# Patient Record
Sex: Female | Born: 1940 | Race: Black or African American | Hispanic: No | Marital: Married | Smoking: Never smoker
Health system: Southern US, Community
[De-identification: ages and names within clinical notes are randomized; demographics above are authoritative.]

## PROBLEM LIST (undated history)

## (undated) DIAGNOSIS — H9190 Unspecified hearing loss, unspecified ear: Secondary | ICD-10-CM

## (undated) DIAGNOSIS — E119 Type 2 diabetes mellitus without complications: Secondary | ICD-10-CM

## (undated) DIAGNOSIS — I10 Essential (primary) hypertension: Secondary | ICD-10-CM

## (undated) DIAGNOSIS — F0390 Unspecified dementia without behavioral disturbance: Secondary | ICD-10-CM

## (undated) HISTORY — PX: ABDOMINAL HYSTERECTOMY: SHX81

---

## 2008-10-09 ENCOUNTER — Emergency Department (HOSPITAL_COMMUNITY): Admission: EM | Admit: 2008-10-09 | Discharge: 2008-10-09 | Payer: Self-pay | Admitting: Emergency Medicine

## 2009-03-21 ENCOUNTER — Ambulatory Visit (HOSPITAL_COMMUNITY): Admission: RE | Admit: 2009-03-21 | Discharge: 2009-03-21 | Payer: Self-pay | Admitting: Ophthalmology

## 2011-01-22 LAB — COMPREHENSIVE METABOLIC PANEL
ALT: 46 U/L — ABNORMAL HIGH (ref 0–35)
AST: 31 U/L (ref 0–37)
Albumin: 3.6 g/dL (ref 3.5–5.2)
CO2: 29 mEq/L (ref 19–32)
Calcium: 9.7 mg/dL (ref 8.4–10.5)
GFR calc Af Amer: 41 mL/min — ABNORMAL LOW (ref >60)
GFR calc non Af Amer: 34 mL/min — ABNORMAL LOW (ref >60)
Sodium: 144 mEq/L (ref 135–145)
Total Protein: 6.4 g/dL (ref 6.0–8.3)

## 2011-01-22 LAB — URINALYSIS, ROUTINE W REFLEX MICROSCOPIC
Bilirubin Urine: NEGATIVE
Glucose, UA: NEGATIVE mg/dL
Ketones, ur: NEGATIVE mg/dL
Specific Gravity, Urine: 1.015 (ref 1.005–1.030)
pH: 5.5 (ref 5.0–8.0)

## 2011-01-22 LAB — CBC
MCHC: 34 g/dL (ref 30.0–36.0)
Platelets: 211 10*3/uL (ref 150–400)
RBC: 4.15 MIL/uL (ref 3.87–5.11)
WBC: 4.4 10*3/uL (ref 4.0–10.5)

## 2011-01-22 LAB — GLUCOSE, CAPILLARY
Glucose-Capillary: 110 mg/dL — ABNORMAL HIGH (ref 70–99)
Glucose-Capillary: 85 mg/dL (ref 70–99)
Glucose-Capillary: 98 mg/dL (ref 70–99)

## 2011-01-22 LAB — URINE MICROSCOPIC-ADD ON

## 2011-02-27 NOTE — Op Note (Signed)
NAMESELEN, SMUCKER                ACCOUNT NO.:  [id number]   MEDICAL RECORD NO.:  [id number]          PATIENT TYPE:  AMB   LOCATION:  SDS                          FACILITY:  [facility]   PHYSICIAN:  Lanna Poche, M.D. 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   POSTOPERATIVE DIAGNOSIS:  Intravitreal hemorrhage with proliferative  diabetic retinopathy, right eye.   PROCEDURE:  Pars plana vitrectomy and panretinal photocoagulation, right  eye.   SURGEON:  Lanna Poche, MD   ANESTHESIA:  General endotracheal.   ESTIMATED BLOOD LOSS:  Less than 1 mL.   COMPLICATIONS:  None.   OPERATIVE NOTE:  The patient was taken to the operating room.  After  induction of general anesthesia, the right eye was prepped and draped in  the usual fashion.  Lid speculum was introduced.  Conjunctival  peritomies developed superotemporally and superonasally.  Hemostasis was  obtained with laser cautery.  Sclerotomies were fashioned 4 mm posterior  to the limbus at 1:30, 10:30 and 7:30.  The superior sclerotomies were  plugged and a 4-mm infusion cannula was secured at the 7:30 sclerotomy  with a temporary suture of 7-0 Vicryl.  The tip was visually inspected  and confirmed to lay in the vitreous cavity.  The Landers ring was  secured with a 7-0 Vicryl suture at  3 and 9.  Plugs were removed and 30-  degree prismatic lens was applied to the surface of the eye.  Central  core followed by peripheral vitrectomy was performed.  There was no  posterior hyaloid attachments and the posterior hyaloid was removed  without difficulty with considerable amount of hemorrhage underneath.  Scleral depression was used to further trim the inferior hemorrhagic  vitreous base.  The magnifying flat lens was applied to  the surface of  eye.  The small hemorrhage that settled in posterior pole was vacuumed  off the surface of the retina without difficulty.  The instruments were  removed from the eye. The holes were plugged.  Lens and ring were  removed.  Inspection with an ophthalmoscope and scleral depression  revealed there to be no peripheral retinal breaks or tears.  The small  gaps in the far peripheral panretinal photocoagulation was filled in,  although this was somewhat difficult in some areas had considerable  cortical spoking.  The superior sclerotomies were then closed with 7-0  Vicryl.  The infusion cannula was removed and preplaced sutures were  secured. The conjunctiva was then drawn and reapproximated with  interrupted running suture of 6-0 plain gut.  The pressure was checked  with Barraquer tonometer and found to lay at 15.  The subconjunctival  space was irrigated with 0.75% Marcaine, followed by subconjunctival  injection of 100 mg  of ceftazidime and 10 mg of Decadron.  Lid speculum was then removed and  mixed antibiotic ointment was applied to the surface of the eye.  An eye  patch and shield was then placed over the patient's right eye and the  patient left the operating room in stable condition.           ______________________________  Lanna Poche, M.D.     JTH/MEDQ  D:  03/21/2009  T:  03/22/2009  Job:  [id number]

## 2011-07-20 LAB — BASIC METABOLIC PANEL
BUN: 20 mg/dL (ref 6–23)
CO2: 33 mEq/L — ABNORMAL HIGH (ref 19–32)
Calcium: 9 mg/dL (ref 8.4–10.5)
GFR calc non Af Amer: 30 mL/min — ABNORMAL LOW (ref >60)
Glucose, Bld: 253 mg/dL — ABNORMAL HIGH (ref 70–99)

## 2011-07-20 LAB — CBC
Hemoglobin: 11.9 g/dL — ABNORMAL LOW (ref 12.0–15.0)
MCHC: 33.4 g/dL (ref 30.0–36.0)
Platelets: 227 10*3/uL (ref 150–400)
RDW: 14.1 % (ref 11.5–15.5)

## 2011-07-20 LAB — DIFFERENTIAL
Basophils Absolute: 0 10*3/uL (ref 0.0–0.1)
Basophils Relative: 1 % (ref 0–1)
Eosinophils Relative: 3 % (ref 0–5)
Lymphocytes Relative: 25 % (ref 12–46)
Monocytes Absolute: 0.6 10*3/uL (ref 0.1–1.0)
Neutro Abs: 3.3 10*3/uL (ref 1.7–7.7)

## 2015-04-28 ENCOUNTER — Encounter: Payer: Self-pay | Admitting: Emergency Medicine

## 2015-04-28 ENCOUNTER — Other Ambulatory Visit: Payer: Self-pay

## 2015-04-28 ENCOUNTER — Emergency Department
Admission: EM | Admit: 2015-04-28 | Discharge: 2015-04-28 | Disposition: A | Discharge status: Home / Self Care | Payer: Medicare Other | Attending: Emergency Medicine | Admitting: Emergency Medicine

## 2015-04-28 DIAGNOSIS — F0390 Unspecified dementia without behavioral disturbance: Secondary | ICD-10-CM | POA: Insufficient documentation

## 2015-04-28 DIAGNOSIS — I10 Essential (primary) hypertension: Secondary | ICD-10-CM | POA: Diagnosis not present

## 2015-04-28 DIAGNOSIS — N390 Urinary tract infection, site not specified: Secondary | ICD-10-CM | POA: Diagnosis not present

## 2015-04-28 DIAGNOSIS — E1165 Type 2 diabetes mellitus with hyperglycemia: Secondary | ICD-10-CM | POA: Insufficient documentation

## 2015-04-28 DIAGNOSIS — R739 Hyperglycemia, unspecified: Secondary | ICD-10-CM

## 2015-04-28 HISTORY — DX: Type 2 diabetes mellitus without complications: E11.9

## 2015-04-28 HISTORY — DX: Essential (primary) hypertension: I10

## 2015-04-28 HISTORY — DX: Unspecified dementia, unspecified severity, without behavioral disturbance, psychotic disturbance, mood disturbance, and anxiety: F03.90

## 2015-04-28 LAB — CBC
HEMATOCRIT: 46.5 % (ref 35.0–47.0)
Hemoglobin: 15.2 g/dL (ref 12.0–16.0)
MCH: 30.1 pg (ref 26.0–34.0)
MCHC: 32.7 g/dL (ref 32.0–36.0)
MCV: 92 fL (ref 80.0–100.0)
PLATELETS: 272 10*3/uL (ref 150–440)
RBC: 5.06 MIL/uL (ref 3.80–5.20)
RDW: 13.6 % (ref 11.5–14.5)
WBC: 7.4 10*3/uL (ref 3.6–11.0)

## 2015-04-28 LAB — BASIC METABOLIC PANEL
ANION GAP: 18 — AB (ref 5–15)
BUN: 83 mg/dL — ABNORMAL HIGH (ref 6–20)
CHLORIDE: 95 mmol/L — AB (ref 101–111)
CO2: 27 mmol/L (ref 22–32)
CREATININE: 2.43 mg/dL — AB (ref 0.44–1.00)
Calcium: 10.2 mg/dL (ref 8.9–10.3)
GFR calc non Af Amer: 19 mL/min — ABNORMAL LOW (ref >60)
GFR, EST AFRICAN AMERICAN: 22 mL/min — AB (ref >60)
Glucose, Bld: 552 mg/dL (ref 65–99)
Potassium: 5.1 mmol/L (ref 3.5–5.1)
Sodium: 140 mmol/L (ref 135–145)

## 2015-04-28 LAB — URINALYSIS COMPLETE WITH MICROSCOPIC (ARMC ONLY)
BILIRUBIN URINE: NEGATIVE
Glucose, UA: >500 mg/dL — AB
NITRITE: NEGATIVE
PROTEIN: NEGATIVE mg/dL
SPECIFIC GRAVITY, URINE: 1.021 (ref 1.005–1.030)
Squamous Epithelial / LPF: NONE SEEN
pH: 5 (ref 5.0–8.0)

## 2015-04-28 LAB — GLUCOSE, CAPILLARY
GLUCOSE-CAPILLARY: 540 mg/dL — AB (ref 65–99)
GLUCOSE-CAPILLARY: 546 mg/dL — AB (ref 65–99)
Glucose-Capillary: 332 mg/dL — ABNORMAL HIGH (ref 65–99)

## 2015-04-28 MED ORDER — INSULIN ASPART 100 UNIT/ML ~~LOC~~ SOLN
10.0000 [IU] | Freq: Once | SUBCUTANEOUS | Status: AC
  Administered 2015-04-28: 10 [IU] via INTRAVENOUS

## 2015-04-28 MED ORDER — CIPROFLOXACIN HCL 500 MG PO TABS
500.0000 mg | ORAL_TABLET | Freq: Once | ORAL | Status: AC
  Administered 2015-04-28: 500 mg via ORAL
  Filled 2015-04-28: qty 1

## 2015-04-28 MED ORDER — INSULIN ASPART 100 UNIT/ML ~~LOC~~ SOLN
SUBCUTANEOUS | Status: AC
  Administered 2015-04-28: 10 [IU] via INTRAVENOUS
  Filled 2015-04-28: qty 10

## 2015-04-28 MED ORDER — CIPROFLOXACIN HCL 500 MG PO TABS: 500.0000 mg | ORAL_TABLET | Freq: Two times a day (BID) | ORAL | Status: AC

## 2015-04-28 MED ORDER — SODIUM CHLORIDE 0.9 % IV SOLN
Freq: Once | INTRAVENOUS | Status: AC
  Administered 2015-04-28: 18:00:00 via INTRAVENOUS

## 2015-04-28 NOTE — ED Provider Notes (Signed)
[facility] Emergency Department Provider Note ____________________________________________  Time seen: Approximately 6:02 PM  I have reviewed the triage vital signs and the nursing notes.   HISTORY  Chief Complaint Hyperglycemia  Limited by severe dementia; history provided by spouse and son  HPI Kari Carr is a 74 y.o. female with severe dementia who presents from home with her husband and son. Her husband is her caregiver and he notes that her blood sugars have been high for 2 weeks in the 200s but today read critical high. She has had increased urination and dysuria and her primary care doctor has sent a urine but they're still waiting on the results.  She recently started Invokana 100 mg 7/8 as HgB A1C was 11 1.5 wk ago.  He is on Lantus 28 units every morning. She has had decreased by mouth intake for the past 5 days but has been drinking. She has not vomited. She has had loose bowel movements this week. No fevers. She had a cough on Monday but none since then.  Baseline, she has a difficult time understanding communicating with caregivers.  She is followed by [facility] Past Medical History  Diagnosis Date  . Dementia   . Hypertension   . Diabetes mellitus without complication     There are no active problems to display for this patient.   Past Surgical History  Procedure Laterality Date  . Abdominal hysterectomy      No current outpatient prescriptions on file.  Allergies Review of patient's allergies indicates not on file.  History reviewed. No pertinent family history.  Social History History  Substance Use Topics  . Smoking status: Never Smoker   . Smokeless tobacco: Not on file  . Alcohol Use: No    Review of Systems Constitutional: No fever ENT: No URI Respiratory: Denies shortness of breath. Gastrointestinal:no vomiting.  No diarrhea.   10-point ROS otherwise  negative.  ____________________________________________   PHYSICAL EXAM:  VITAL SIGNS: ED Triage Vitals  Enc Vitals Group     BP 04/28/15 1734 145/79 mmHg     Pulse Rate 04/28/15 1734 90     Resp 04/28/15 1734 16     Temp 04/28/15 1734 98.2 F (36.8 C)     Temp Source 04/28/15 1734 Oral     SpO2 04/28/15 1734 98 %     Weight 04/28/15 1734 178 lb (80.74 kg)     Height 04/28/15 1734 5\' 8"  (1.727 m)     Head Cir --      Peak Flow --      Pain Score --      Pain Loc --      Pain Edu? --      Excl. in GC? --    Constitutional: Alert. Well appearing and in no acute distress. Eyes: Conjunctivae are normal. PERRL. EOMI. Head: Atraumatic. Nose: No congestion/rhinnorhea. Mouth/Throat: Mucous membranes are slightly dry.  Oropharynx non-erythematous. Neck: No stridor.   Lymphatic: No cervical lymphadenopathy. Cardiovascular: Normal rate, regular rhythm. Grossly normal heart sounds.  Peripheral pulses 2+ B Respiratory: Normal respiratory effort.  No retractions. Lungs CTAB. Gastrointestinal: Soft.  Suprapubic tenderness without rebound or guarding. No distention. Normal bowel sounds.  Musculoskeletal: No lower extremity tenderness nor edema.  No calf TTP. Neurologic:  Normal speech and language. No gross focal neurologic deficits are appreciated. Speech is normal.  Skin:  Skin is warm, dry and intact. No rash noted. Psychiatric: Mood and affect are normal. Speech and behavior are  normal.  ____________________________________________   LABS (all labs ordered are listed, but only abnormal results are displayed)  Labs Reviewed  GLUCOSE, CAPILLARY - Abnormal; Notable for the following:    Glucose-Capillary 546 (*)    All other components within normal limits  GLUCOSE, CAPILLARY - Abnormal; Notable for the following:    Glucose-Capillary 540 (*)    All other components within normal limits   ____________________________________________  EKG   Date: 04/28/2015  Rate: 86   Rhythm: normal sinus rhythm  QRS Axis: normal  Intervals: normal  ST/T Wave abnormalities: normal  Conduction Disutrbances: none  Narrative Interpretation: unremarkable  ____________________________________________   PROCEDURES  Procedure(s) performed: none  Critical Care performed: none ____________________________________________   INITIAL IMPRESSION / ASSESSMENT AND PLAN / ED COURSE  Pertinent labs & imaging results that were available during my care of the patient were reviewed by me and considered in my medical decision making (see chart for details).  ----------------------------------------- 8:23 PM on 04/28/2015 -----------------------------------------  I suspect high blood sugar is from UTI.  Discussed plan with patient's husband and son. Patient's husband feels comfortable taking her home and will call family practice in the morning to find out if they would like to adjust her diabetic regimen. I will start Cipro while her urine culture is pending. ____________________________________________   FINAL CLINICAL IMPRESSION(S) / ED DIAGNOSES  UTI, hyperglycemia     Maurilio Lovely, MD 04/28/15 2024

## 2015-04-28 NOTE — ED Notes (Signed)
Husband states pts bs was 660 today.  Pt confused, husband states she has dementia

## 2015-04-28 NOTE — ED Notes (Signed)
Pt has HX of dementia. Husband reports FSBS of 534. Pt is not oriented to time or place.

## 2015-04-28 NOTE — ED Notes (Signed)
CRITICAL VALUE ALERT  Critical value received:  552 glucose  Date of notification:  04/28/2015  Time of notification:  1858  Critical value read back:Yes.    Nurse who received alert:  Art therapistAmber RN  MD notified (1st page):  Mclauren  Time of first page:  1858  MD notified (2nd page):  Time of second page:  Responding MD:  Mclauren  Time MD responded:  [id number]

## 2015-04-28 NOTE — Discharge Instructions (Signed)
Call family practice in the morning to find out if they would like to change diabetic medications and to let them know that Cipro has been started. Return to the ER for new or worsening symptoms, vomiting, fever, or for any other concerns.   Urinary Tract Infection Urinary tract infections (UTIs) can develop anywhere along your urinary tract. Your urinary tract is your body's drainage system for removing wastes and extra water. Your urinary tract includes two kidneys, two ureters, a bladder, and a urethra. Your kidneys are a pair of bean-shaped organs. Each kidney is about the size of your fist. They are located below your ribs, one on each side of your spine. CAUSES Infections are caused by microbes, which are microscopic organisms, including fungi, viruses, and bacteria. These organisms are so small that they can only be seen through a microscope. Bacteria are the microbes that most commonly cause UTIs. SYMPTOMS  Symptoms of UTIs may vary by age and gender of the patient and by the location of the infection. Symptoms in young women typically include a frequent and intense urge to urinate and a painful, burning feeling in the bladder or urethra during urination. Older women and men are more likely to be tired, shaky, and weak and have muscle aches and abdominal pain. A fever may mean the infection is in your kidneys. Other symptoms of a kidney infection include pain in your back or sides below the ribs, nausea, and vomiting. DIAGNOSIS To diagnose a UTI, your caregiver will ask you about your symptoms. Your caregiver also will ask to provide a urine sample. The urine sample will be tested for bacteria and white blood cells. White blood cells are made by your body to help fight infection. TREATMENT  Typically, UTIs can be treated with medication. Because most UTIs are caused by a bacterial infection, they usually can be treated with the use of antibiotics. The choice of antibiotic and length of treatment  depend on your symptoms and the type of bacteria causing your infection. HOME CARE INSTRUCTIONS  If you were prescribed antibiotics, take them exactly as your caregiver instructs you. Finish the medication even if you feel better after you have only taken some of the medication.  Drink enough water and fluids to keep your urine clear or pale yellow.  Avoid caffeine, tea, and carbonated beverages. They tend to irritate your bladder.  Empty your bladder often. Avoid holding urine for long periods of time.  Empty your bladder before and after sexual intercourse.  After a bowel movement, women should cleanse from front to back. Use each tissue only once. SEEK MEDICAL CARE IF:   You have back pain.  You develop a fever.  Your symptoms do not begin to resolve within 3 days. SEEK IMMEDIATE MEDICAL CARE IF:   You have severe back pain or lower abdominal pain.  You develop chills.  You have nausea or vomiting.  You have continued burning or discomfort with urination. MAKE SURE YOU:   Understand these instructions.  Will watch your condition.  Will get help right away if you are not doing well or get worse. Document Released: 07/11/2005 Document Revised: 04/01/2012 Document Reviewed: 11/09/2011 [facility] Patient Information 2015 [place]. This information is not intended to replace advice given to you by your health care provider. Make sure you discuss any questions you have with your health care provider.  High Blood Sugar High blood sugar (hyperglycemia) means that the level of sugar in your blood is higher than it should  be. Signs of high blood sugar include:  Feeling thirsty.  Frequent peeing (urinating).  Feeling tired or sleepy.  Dry mouth.  Vision changes.  Feeling weak.  Feeling hungry but losing weight.  Numbness and tingling in your hands or feet.  Headache. When you ignore these signs, your blood sugar may keep going up. These problems may get worse,  and other problems may begin. HOME CARE  Check your blood sugars as told by your doctor. Write down the numbers with the date and time.  Take the right amount of insulin or diabetes pills at the right time. Write down the dose with date and time.  Refill your insulin or diabetes pills before running out.  Watch what you eat. Follow your meal plan.  Drink liquids without sugar, such as water. Check with your doctor if you have kidney or heart disease.  Follow your doctor's orders for exercise. Exercise at the same time of day.  Keep your doctor's appointments. GET HELP RIGHT AWAY IF:   You have trouble thinking or are confused.  You have fast breathing with fruity smelling breath.  You pass out (faint).  You have 2 to 3 days of high blood sugars and you do not know why.  You have chest pain.  You are feeling sick to your stomach (nauseous) or throwing up (vomiting).  You have sudden vision changes. MAKE SURE YOU:   Understand these instructions.  Will watch your condition.  Will get help right away if you are not doing well or get worse. Document Released: 07/29/2009 Document Revised: 12/24/2011 Document Reviewed: 07/29/2009 [facility] Patient Information 2015 [place], [place]. This information is not intended to replace advice given to you by your health care provider. Make sure you discuss any questions you have with your health care provider.

## 2015-05-01 LAB — URINE CULTURE

## 2015-06-14 ENCOUNTER — Encounter: Payer: Self-pay | Admitting: Emergency Medicine

## 2015-06-14 ENCOUNTER — Emergency Department

## 2015-06-14 DIAGNOSIS — N3000 Acute cystitis without hematuria: Secondary | ICD-10-CM | POA: Diagnosis present

## 2015-06-14 DIAGNOSIS — K5900 Constipation, unspecified: Secondary | ICD-10-CM | POA: Diagnosis present

## 2015-06-14 DIAGNOSIS — M25559 Pain in unspecified hip: Secondary | ICD-10-CM | POA: Diagnosis present

## 2015-06-14 DIAGNOSIS — R339 Retention of urine, unspecified: Secondary | ICD-10-CM | POA: Diagnosis present

## 2015-06-14 DIAGNOSIS — R748 Abnormal levels of other serum enzymes: Secondary | ICD-10-CM | POA: Diagnosis present

## 2015-06-14 DIAGNOSIS — Z79899 Other long term (current) drug therapy: Secondary | ICD-10-CM

## 2015-06-14 DIAGNOSIS — H9190 Unspecified hearing loss, unspecified ear: Secondary | ICD-10-CM | POA: Diagnosis present

## 2015-06-14 DIAGNOSIS — R1030 Lower abdominal pain, unspecified: Secondary | ICD-10-CM | POA: Diagnosis present

## 2015-06-14 DIAGNOSIS — N183 Chronic kidney disease, stage 3 (moderate): Secondary | ICD-10-CM | POA: Diagnosis present

## 2015-06-14 DIAGNOSIS — E785 Hyperlipidemia, unspecified: Secondary | ICD-10-CM | POA: Diagnosis present

## 2015-06-14 DIAGNOSIS — R531 Weakness: Secondary | ICD-10-CM | POA: Diagnosis not present

## 2015-06-14 DIAGNOSIS — Z515 Encounter for palliative care: Secondary | ICD-10-CM

## 2015-06-14 DIAGNOSIS — I129 Hypertensive chronic kidney disease with stage 1 through stage 4 chronic kidney disease, or unspecified chronic kidney disease: Secondary | ICD-10-CM | POA: Diagnosis present

## 2015-06-14 DIAGNOSIS — Z66 Do not resuscitate: Secondary | ICD-10-CM | POA: Diagnosis present

## 2015-06-14 DIAGNOSIS — F0390 Unspecified dementia without behavioral disturbance: Secondary | ICD-10-CM | POA: Diagnosis present

## 2015-06-14 DIAGNOSIS — Z794 Long term (current) use of insulin: Secondary | ICD-10-CM

## 2015-06-14 DIAGNOSIS — Z6822 Body mass index (BMI) 22.0-22.9, adult: Secondary | ICD-10-CM

## 2015-06-14 DIAGNOSIS — A419 Sepsis, unspecified organism: Secondary | ICD-10-CM | POA: Diagnosis not present

## 2015-06-14 DIAGNOSIS — E43 Unspecified severe protein-calorie malnutrition: Secondary | ICD-10-CM | POA: Diagnosis present

## 2015-06-14 DIAGNOSIS — E1122 Type 2 diabetes mellitus with diabetic chronic kidney disease: Secondary | ICD-10-CM | POA: Diagnosis present

## 2015-06-14 LAB — LIPASE, BLOOD: Lipase: 13 U/L — ABNORMAL LOW (ref 22–51)

## 2015-06-14 LAB — CBC WITH DIFFERENTIAL/PLATELET
BASOS ABS: 0.1 10*3/uL (ref 0–0.1)
BASOS PCT: 1 %
EOS ABS: 0 10*3/uL (ref 0–0.7)
Eosinophils Relative: 0 %
HEMATOCRIT: 48 % — AB (ref 35.0–47.0)
Hemoglobin: 15.3 g/dL (ref 12.0–16.0)
Lymphocytes Relative: 16 %
Lymphs Abs: 2.3 10*3/uL (ref 1.0–3.6)
MCH: 29.6 pg (ref 26.0–34.0)
MCHC: 31.9 g/dL — ABNORMAL LOW (ref 32.0–36.0)
MCV: 92.9 fL (ref 80.0–100.0)
MONO ABS: 1.1 10*3/uL — AB (ref 0.2–0.9)
Monocytes Relative: 8 %
NEUTROS ABS: 10.5 10*3/uL — AB (ref 1.4–6.5)
NEUTROS PCT: 75 %
Platelets: 221 10*3/uL (ref 150–440)
RBC: 5.17 MIL/uL (ref 3.80–5.20)
RDW: 14.7 % — AB (ref 11.5–14.5)
WBC: 14.1 10*3/uL — ABNORMAL HIGH (ref 3.6–11.0)

## 2015-06-14 LAB — COMPREHENSIVE METABOLIC PANEL
ALK PHOS: 93 U/L (ref 38–126)
ALT: 22 U/L (ref 14–54)
ANION GAP: 12 (ref 5–15)
AST: 30 U/L (ref 15–41)
Albumin: 3.5 g/dL (ref 3.5–5.0)
BILIRUBIN TOTAL: 0.7 mg/dL (ref 0.3–1.2)
BUN: 51 mg/dL — ABNORMAL HIGH (ref 6–20)
CALCIUM: 10.2 mg/dL (ref 8.9–10.3)
CO2: 28 mmol/L (ref 22–32)
Chloride: 110 mmol/L (ref 101–111)
Creatinine, Ser: 1.83 mg/dL — ABNORMAL HIGH (ref 0.44–1.00)
GFR calc non Af Amer: 26 mL/min — ABNORMAL LOW (ref >60)
GFR, EST AFRICAN AMERICAN: 31 mL/min — AB (ref >60)
GLUCOSE: 246 mg/dL — AB (ref 65–99)
Potassium: 4.5 mmol/L (ref 3.5–5.1)
Sodium: 150 mmol/L — ABNORMAL HIGH (ref 135–145)
TOTAL PROTEIN: 7.9 g/dL (ref 6.5–8.1)

## 2015-06-14 LAB — GLUCOSE, CAPILLARY: GLUCOSE-CAPILLARY: 232 mg/dL — AB (ref 65–99)

## 2015-06-14 NOTE — ED Notes (Signed)
Pt arrived to the ED accompanied by her husband and family for complaints of left hip pain and some abdominal pain. Pt is a non verbal dementia Pt, therefore the family are the ones speaking for the Pt. Pt's family states that 2 days ago while they were bathing the Pt they noticed that the Pt was expressing and making gestures of having pain when moving her left hip/leg and when her abdomen was touched. Sherilyn Cooter RN noticed that  the Pt showed grimace when passively moving the left leg but no signs of pain when inspecting the Pt's abdomen. Pt's last BM was today according to the family and they described it as normal, but according to them a day ago the Pt's stool was hard, round and they saw some traces of bright blood. Pt is alert but non-verbal in no apparent distress.

## 2015-06-15 ENCOUNTER — Emergency Department

## 2015-06-15 ENCOUNTER — Inpatient Hospital Stay
Admission: EM | Admit: 2015-06-15 | Discharge: 2015-06-18 | DRG: 871 | Disposition: A | Discharge status: Hospice | Attending: Internal Medicine | Admitting: Internal Medicine

## 2015-06-15 ENCOUNTER — Encounter: Payer: Self-pay | Admitting: Internal Medicine

## 2015-06-15 DIAGNOSIS — A419 Sepsis, unspecified organism: Secondary | ICD-10-CM | POA: Diagnosis present

## 2015-06-15 DIAGNOSIS — R778 Other specified abnormalities of plasma proteins: Secondary | ICD-10-CM

## 2015-06-15 DIAGNOSIS — R7989 Other specified abnormal findings of blood chemistry: Secondary | ICD-10-CM

## 2015-06-15 DIAGNOSIS — R531 Weakness: Secondary | ICD-10-CM

## 2015-06-15 DIAGNOSIS — E1122 Type 2 diabetes mellitus with diabetic chronic kidney disease: Secondary | ICD-10-CM | POA: Diagnosis present

## 2015-06-15 DIAGNOSIS — N183 Chronic kidney disease, stage 3 (moderate): Secondary | ICD-10-CM | POA: Diagnosis present

## 2015-06-15 DIAGNOSIS — M25559 Pain in unspecified hip: Secondary | ICD-10-CM | POA: Diagnosis present

## 2015-06-15 DIAGNOSIS — H9190 Unspecified hearing loss, unspecified ear: Secondary | ICD-10-CM | POA: Diagnosis present

## 2015-06-15 DIAGNOSIS — R1030 Lower abdominal pain, unspecified: Secondary | ICD-10-CM

## 2015-06-15 DIAGNOSIS — F0390 Unspecified dementia without behavioral disturbance: Secondary | ICD-10-CM | POA: Diagnosis present

## 2015-06-15 DIAGNOSIS — Z79899 Other long term (current) drug therapy: Secondary | ICD-10-CM | POA: Diagnosis not present

## 2015-06-15 DIAGNOSIS — Z66 Do not resuscitate: Secondary | ICD-10-CM | POA: Diagnosis present

## 2015-06-15 DIAGNOSIS — I129 Hypertensive chronic kidney disease with stage 1 through stage 4 chronic kidney disease, or unspecified chronic kidney disease: Secondary | ICD-10-CM | POA: Diagnosis present

## 2015-06-15 DIAGNOSIS — E43 Unspecified severe protein-calorie malnutrition: Secondary | ICD-10-CM | POA: Insufficient documentation

## 2015-06-15 DIAGNOSIS — R748 Abnormal levels of other serum enzymes: Secondary | ICD-10-CM | POA: Diagnosis present

## 2015-06-15 DIAGNOSIS — Z794 Long term (current) use of insulin: Secondary | ICD-10-CM | POA: Diagnosis not present

## 2015-06-15 DIAGNOSIS — N390 Urinary tract infection, site not specified: Secondary | ICD-10-CM

## 2015-06-15 DIAGNOSIS — R339 Retention of urine, unspecified: Secondary | ICD-10-CM | POA: Diagnosis present

## 2015-06-15 DIAGNOSIS — E785 Hyperlipidemia, unspecified: Secondary | ICD-10-CM | POA: Diagnosis present

## 2015-06-15 DIAGNOSIS — N3000 Acute cystitis without hematuria: Secondary | ICD-10-CM | POA: Diagnosis present

## 2015-06-15 DIAGNOSIS — Z515 Encounter for palliative care: Secondary | ICD-10-CM | POA: Diagnosis not present

## 2015-06-15 DIAGNOSIS — Z6822 Body mass index (BMI) 22.0-22.9, adult: Secondary | ICD-10-CM | POA: Diagnosis not present

## 2015-06-15 DIAGNOSIS — K5900 Constipation, unspecified: Secondary | ICD-10-CM | POA: Diagnosis present

## 2015-06-15 HISTORY — DX: Unspecified hearing loss, unspecified ear: H91.90

## 2015-06-15 LAB — CBC
HEMATOCRIT: 45.4 % (ref 35.0–47.0)
HEMOGLOBIN: 14.4 g/dL (ref 12.0–16.0)
MCH: 29.9 pg (ref 26.0–34.0)
MCHC: 31.8 g/dL — AB (ref 32.0–36.0)
MCV: 93.9 fL (ref 80.0–100.0)
Platelets: 142 10*3/uL — ABNORMAL LOW (ref 150–440)
RBC: 4.83 MIL/uL (ref 3.80–5.20)
RDW: 15 % — ABNORMAL HIGH (ref 11.5–14.5)
WBC: 10.2 10*3/uL (ref 3.6–11.0)

## 2015-06-15 LAB — URINALYSIS COMPLETE WITH MICROSCOPIC (ARMC ONLY)
BILIRUBIN URINE: NEGATIVE
Hgb urine dipstick: NEGATIVE
KETONES UR: NEGATIVE mg/dL
Nitrite: NEGATIVE
PROTEIN: NEGATIVE mg/dL
SPECIFIC GRAVITY, URINE: 1.017 (ref 1.005–1.030)
pH: 5 (ref 5.0–8.0)

## 2015-06-15 LAB — BASIC METABOLIC PANEL
ANION GAP: 9 (ref 5–15)
BUN: 53 mg/dL — ABNORMAL HIGH (ref 6–20)
CALCIUM: 9.7 mg/dL (ref 8.9–10.3)
CO2: 25 mmol/L (ref 22–32)
Chloride: 118 mmol/L — ABNORMAL HIGH (ref 101–111)
Creatinine, Ser: 1.47 mg/dL — ABNORMAL HIGH (ref 0.44–1.00)
GFR, EST AFRICAN AMERICAN: 40 mL/min — AB (ref >60)
GFR, EST NON AFRICAN AMERICAN: 34 mL/min — AB (ref >60)
GLUCOSE: 161 mg/dL — AB (ref 65–99)
POTASSIUM: 4.5 mmol/L (ref 3.5–5.1)
SODIUM: 152 mmol/L — AB (ref 135–145)

## 2015-06-15 LAB — TROPONIN I: Troponin I: 0.05 ng/mL — ABNORMAL HIGH (ref <0.031)

## 2015-06-15 LAB — GLUCOSE, CAPILLARY
Glucose-Capillary: 204 mg/dL — ABNORMAL HIGH (ref 65–99)
Glucose-Capillary: 171 mg/dL — ABNORMAL HIGH (ref 65–99)
Glucose-Capillary: 147 mg/dL — ABNORMAL HIGH (ref 65–99)
Glucose-Capillary: 129 mg/dL — ABNORMAL HIGH (ref 65–99)

## 2015-06-15 LAB — HEMOGLOBIN A1C: Hgb A1c MFr Bld: 11 % — ABNORMAL HIGH (ref 4.0–6.0)

## 2015-06-15 LAB — TSH: TSH: 1.615 u[IU]/mL (ref 0.350–4.500)

## 2015-06-15 MED ORDER — ACETAMINOPHEN 650 MG RE SUPP: 650.0000 mg | Freq: Four times a day (QID) | RECTAL | Status: DC | PRN

## 2015-06-15 MED ORDER — ACETAMINOPHEN 325 MG PO TABS: 650.0000 mg | ORAL_TABLET | Freq: Four times a day (QID) | ORAL | Status: DC | PRN

## 2015-06-15 MED ORDER — ONDANSETRON HCL 4 MG/2ML IJ SOLN: 4.0000 mg | Freq: Four times a day (QID) | INTRAMUSCULAR | Status: DC | PRN

## 2015-06-15 MED ORDER — SODIUM CHLORIDE 0.9 % IV BOLUS (SEPSIS)
500.0000 mL | Freq: Once | INTRAVENOUS | Status: AC
  Administered 2015-06-15: 500 mL via INTRAVENOUS

## 2015-06-15 MED ORDER — LISINOPRIL-HYDROCHLOROTHIAZIDE 20-12.5 MG PO TABS: 1.0000 | ORAL_TABLET | Freq: Every day | ORAL | Status: DC

## 2015-06-15 MED ORDER — INSULIN ASPART 100 UNIT/ML ~~LOC~~ SOLN
4.0000 [IU] | Freq: Once | SUBCUTANEOUS | Status: AC
Start: 2015-06-15 — End: 2015-06-15
  Administered 2015-06-15: 4 [IU] via SUBCUTANEOUS
  Filled 2015-06-15: qty 4

## 2015-06-15 MED ORDER — ENSURE ENLIVE PO LIQD
237.0000 mL | Freq: Three times a day (TID) | ORAL | Status: DC
  Administered 2015-06-16 – 2015-06-18 (×6): 237 mL via ORAL

## 2015-06-15 MED ORDER — LISINOPRIL 20 MG PO TABS
20.0000 mg | ORAL_TABLET | Freq: Every day | ORAL | Status: DC
  Administered 2015-06-15 – 2015-06-18 (×4): 20 mg via ORAL
  Filled 2015-06-15 (×4): qty 1

## 2015-06-15 MED ORDER — ONDANSETRON HCL 4 MG PO TABS: 4.0000 mg | ORAL_TABLET | Freq: Four times a day (QID) | ORAL | Status: DC | PRN

## 2015-06-15 MED ORDER — INSULIN GLARGINE 100 UNIT/ML ~~LOC~~ SOLN
20.0000 [IU] | Freq: Every day | SUBCUTANEOUS | Status: DC
  Administered 2015-06-16: 20 [IU] via SUBCUTANEOUS
  Filled 2015-06-15 (×3): qty 0.2

## 2015-06-15 MED ORDER — SIMVASTATIN 20 MG PO TABS
20.0000 mg | ORAL_TABLET | Freq: Every day | ORAL | Status: DC
  Administered 2015-06-15 – 2015-06-17 (×3): 20 mg via ORAL
  Filled 2015-06-15 (×3): qty 1

## 2015-06-15 MED ORDER — SODIUM CHLORIDE 0.9 % IJ SOLN
3.0000 mL | Freq: Two times a day (BID) | INTRAMUSCULAR | Status: DC
  Administered 2015-06-15 – 2015-06-17 (×4): 3 mL via INTRAVENOUS

## 2015-06-15 MED ORDER — HYDROCHLOROTHIAZIDE 12.5 MG PO CAPS
12.5000 mg | ORAL_CAPSULE | Freq: Every day | ORAL | Status: DC
  Administered 2015-06-16 – 2015-06-17 (×2): 12.5 mg via ORAL
  Filled 2015-06-15 (×3): qty 1

## 2015-06-15 MED ORDER — MORPHINE SULFATE (PF) 2 MG/ML IV SOLN: 1.0000 mg | INTRAVENOUS | Status: DC | PRN

## 2015-06-15 MED ORDER — CLONIDINE HCL 0.1 MG PO TABS
0.1000 mg | ORAL_TABLET | Freq: Two times a day (BID) | ORAL | Status: DC
  Administered 2015-06-15 – 2015-06-17 (×4): 0.1 mg via ORAL
  Filled 2015-06-15 (×6): qty 1

## 2015-06-15 MED ORDER — DEXTROSE 5 % IV SOLN
1.0000 g | INTRAVENOUS | Status: DC
  Administered 2015-06-15 – 2015-06-18 (×4): 1 g via INTRAVENOUS
  Filled 2015-06-15 (×5): qty 10

## 2015-06-15 MED ORDER — HEPARIN SODIUM (PORCINE) 5000 UNIT/ML IJ SOLN
5000.0000 [IU] | Freq: Three times a day (TID) | INTRAMUSCULAR | Status: DC
  Administered 2015-06-15 (×2): 5000 [IU] via SUBCUTANEOUS
  Filled 2015-06-15 (×2): qty 1

## 2015-06-15 MED ORDER — INSULIN ASPART 100 UNIT/ML ~~LOC~~ SOLN
0.0000 [IU] | Freq: Three times a day (TID) | SUBCUTANEOUS | Status: DC
  Administered 2015-06-15: 3 [IU] via SUBCUTANEOUS
  Administered 2015-06-15: 2 [IU] via SUBCUTANEOUS
  Administered 2015-06-15: 5 [IU] via SUBCUTANEOUS
  Administered 2015-06-16: 15 [IU] via SUBCUTANEOUS
  Administered 2015-06-16 – 2015-06-17 (×3): 3 [IU] via SUBCUTANEOUS
  Administered 2015-06-17 (×2): 5 [IU] via SUBCUTANEOUS
  Administered 2015-06-18: 11 [IU] via SUBCUTANEOUS
  Filled 2015-06-15: qty 5
  Filled 2015-06-15: qty 11
  Filled 2015-06-15 (×2): qty 3
  Filled 2015-06-15 (×2): qty 5
  Filled 2015-06-15: qty 2
  Filled 2015-06-15: qty 15
  Filled 2015-06-15: qty 5
  Filled 2015-06-15: qty 3

## 2015-06-15 MED ORDER — AMLODIPINE BESYLATE 5 MG PO TABS
5.0000 mg | ORAL_TABLET | Freq: Every day | ORAL | Status: DC
  Administered 2015-06-15 – 2015-06-18 (×4): 5 mg via ORAL
  Filled 2015-06-15 (×4): qty 1

## 2015-06-15 MED ORDER — DOCUSATE SODIUM 100 MG PO CAPS
100.0000 mg | ORAL_CAPSULE | Freq: Two times a day (BID) | ORAL | Status: DC
  Administered 2015-06-16 – 2015-06-17 (×2): 100 mg via ORAL
  Filled 2015-06-15 (×4): qty 1

## 2015-06-15 MED ORDER — SODIUM CHLORIDE 0.9 % IV SOLN
INTRAVENOUS | Status: DC
  Administered 2015-06-15: 06:00:00 via INTRAVENOUS

## 2015-06-15 MED ORDER — SODIUM CHLORIDE 0.45 % IV SOLN
INTRAVENOUS | Status: DC
  Administered 2015-06-15 – 2015-06-17 (×2): via INTRAVENOUS

## 2015-06-15 MED ORDER — ENOXAPARIN SODIUM 30 MG/0.3ML ~~LOC~~ SOLN
30.0000 mg | SUBCUTANEOUS | Status: DC
  Administered 2015-06-15: 30 mg via SUBCUTANEOUS
  Filled 2015-06-15: qty 0.3

## 2015-06-15 NOTE — ED Notes (Signed)
Family to desk inquiring over wait time and lab results; pt noted sitting in w/c in lobby, alert with no distress; updated family on wait time; reviewed triage notes and labs; charge called and notified of need for next bed for further evaluation

## 2015-06-15 NOTE — Progress Notes (Addendum)
Initial Nutrition Assessment  DOCUMENTATION CODES:   Severe malnutrition in context of acute illness/injury  INTERVENTION:   Meals and Snacks: Cater to patient preferences; recommend atleast dysphagia III diet order for chopped meats with gravy and no raw vegetables Coordination of Care: if pt at risk for aspiration, recommend SLP evaulation. Medical Food Supplement Therapy: will recommend Ensure Enlive po TID, each supplement provides 350 kcal and 20 grams of protein, as pt was drinking PTA.   NUTRITION DIAGNOSIS:   Inadequate oral intake related to acute illness as evidenced by per patient/family report.  GOAL:   Patient will meet greater than or equal to 90% of their needs  MONITOR:    (Energy Intake, Digestive System, Anthropometrics, Electrolyte and renal Profile)  REASON FOR ASSESSMENT:   Consult Poor PO  ASSESSMENT:   Pt admitted with sepsis with UTI. Pt with h/o dementia, minimally verbal.  Past Medical History  Diagnosis Date  . Dementia   . Hypertension   . Diabetes mellitus without complication   . Hearing deficit     Diet Order:  DIET DYS 3 Room service appropriate?: Yes; Fluid consistency:: Thin    Current Nutrition: Per husband pt ate 0% this am.   Food/Nutrition-Related History: Per husband pt was eating as usual until this past Friday; Pt has been eating only broth since Friday. Husband reports pt taking Ensure up until Friday but pt did not want Ensure either. Pt husband says usually pt eats breakfast and dinner but not usually lunch. Pt husband reports pt sometimes has difficulty chewing/swallowing.   Medications: NS at 157mL/hr, Lantus, Novolog, Colace  Electrolyte/Renal Profile and Glucose Profile:   Recent Labs Lab 06/14/15 2139  NA 150*  K 4.5  CL 110  CO2 28  BUN 51*  CREATININE 1.83*  CALCIUM 10.2  GLUCOSE 246*   Protein Profile:   Recent Labs Lab 06/14/15 2139  ALBUMIN 3.5    Gastrointestinal Profile: Last BM:   06/15/2015   Nutrition-Focused Physical Exam Findings: Nutrition-Focused physical exam completed. Findings are WDL for fat depletion, muscle depletion, and edema.    Weight Change: Pt husband reports pt weight of 155lbs last Tuesday at [facility] office outpatient (6% weight loss in one week) and 180lbs one month ago. Per [facility], 19% weight loss in 5 weeks.   Skin:  Reviewed, no issues  Last BM:  06/15/2015  Height:   Ht Readings from Last 1 Encounters:  06/14/15  (1.753 m)    Weight:   Wt Readings from Last 1 Encounters:  06/15/15 145 lb 6.4 oz (65.953 kg)    Wt Readings from Last 10 Encounters:  06/15/15 145 lb 6.4 oz (65.953 kg)  04/28/15 178 lb (80.74 kg)    BMI:  Body mass index is 21.46 kg/(m^2).  Estimated Nutritional Needs:   Kcal:  BEE: 1233kcals, TEE: (IF 1.1-1.3)(AF 1.2) 1628-1904kcals  Protein:  53-65g protein (0.8-1.0g/kg)   Fluid:  1648-1937mL of fluid (25-68mL/kg)  EDUCATION NEEDS:   No education needs identified at this time   HIGH Care Level  Leda Quail, RD, LDN Pager [phone]

## 2015-06-15 NOTE — ED Notes (Signed)
POC CBG= 285 mg/dL

## 2015-06-15 NOTE — H&P (Signed)
Kari Carr is an 74 y.o. female.   Chief Complaint: Abdominal pain HPI: Patient presents emergency department after indicating that she had abdominal pain. The patient is nonverbal secondary to dementia but she is usually able to stand and walk around the house on her own. She has been significantly weaker and prefers to lay in bed for the last 5 days. Patient's husband denies that she has had fever, nausea or vomiting. He said he states that she has been pointing to her abdomen and rubbing her belly as if she is in pain. She also seems to be in pain when she is rolled in bed particularly to her left side which appears to cause her some pain as well. In the emergency department the patient was found to have a urinary tract infection which prompted the emergency department staff to call for admission.  Past Medical History  Diagnosis Date  . Dementia   . Hypertension   . Diabetes mellitus without complication   . Hearing deficit     Past Surgical History  Procedure Laterality Date  . Abdominal hysterectomy      Family History  Problem Relation Age of Onset  . Diabetes Mellitus II Mother    Social History:  reports that she has never smoked. She does not have any smokeless tobacco history on file. She reports that she does not drink alcohol or use illicit drugs.  Allergies: No Known Allergies  Prior to Admission medications   Medication Sig Start Date End Date Taking? Authorizing Provider  amLODipine (NORVASC) 5 MG tablet Take 1 tablet by mouth daily. 05/24/15  Yes Historical Provider, MD  cloNIDine (CATAPRES) 0.1 MG tablet Take 0.1 mg by mouth 2 (two) times daily.   Yes Historical Provider, MD  LANTUS 100 UNIT/ML injection Inject 28 Units as directed daily. 04/28/15  Yes Historical Provider, MD  lisinopril-hydrochlorothiazide (PRINZIDE,ZESTORETIC) 20-12.5 MG per tablet Take 1 tablet by mouth daily.   Yes Historical Provider, MD  simvastatin (ZOCOR) 20 MG tablet Take 20 mg by mouth  daily.   Yes Historical Provider, MD     Results for orders placed or performed during the hospital encounter of 06/15/15 (from the past 48 hour(s))  Lipase, blood     Status: Abnormal   Collection Time: 06/14/15  9:39 PM  Result Value Ref Range   Lipase 13 (L) 22 - 51 U/L  Comprehensive metabolic panel     Status: Abnormal   Collection Time: 06/14/15  9:39 PM  Result Value Ref Range   Sodium 150 (H) 135 - 145 mmol/L   Potassium 4.5 3.5 - 5.1 mmol/L   Chloride 110 101 - 111 mmol/L   CO2 28 22 - 32 mmol/L   Glucose, Bld 246 (H) 65 - 99 mg/dL   BUN 51 (H) 6 - 20 mg/dL   Creatinine, Ser 1.83 (H) 0.44 - 1.00 mg/dL   Calcium 10.2 8.9 - 10.3 mg/dL   Total Protein 7.9 6.5 - 8.1 g/dL   Albumin 3.5 3.5 - 5.0 g/dL   AST 30 15 - 41 U/L   ALT 22 14 - 54 U/L   Alkaline Phosphatase 93 38 - 126 U/L   Total Bilirubin 0.7 0.3 - 1.2 mg/dL   GFR calc non Af Amer 26 (L) >60 mL/min   GFR calc Af Amer 31 (L) >60 mL/min    Comment: (NOTE) The eGFR has been calculated using the CKD EPI equation. This calculation has not been validated in all clinical situations. eGFR's  persistently <60 mL/min signify possible Chronic Kidney Disease.    Anion gap 12 5 - 15  CBC WITH DIFFERENTIAL     Status: Abnormal   Collection Time: 06/14/15  9:39 PM  Result Value Ref Range   WBC 14.1 (H) 3.6 - 11.0 K/uL   RBC 5.17 3.80 - 5.20 MIL/uL   Hemoglobin 15.3 12.0 - 16.0 g/dL   HCT 48.0 (H) 35.0 - 47.0 %   MCV 92.9 80.0 - 100.0 fL   MCH 29.6 26.0 - 34.0 pg   MCHC 31.9 (L) 32.0 - 36.0 g/dL   RDW 14.7 (H) 11.5 - 14.5 %   Platelets 221 150 - 440 K/uL   Neutrophils Relative % 75 %   Neutro Abs 10.5 (H) 1.4 - 6.5 K/uL   Lymphocytes Relative 16 %   Lymphs Abs 2.3 1.0 - 3.6 K/uL   Monocytes Relative 8 %   Monocytes Absolute 1.1 (H) 0.2 - 0.9 K/uL   Eosinophils Relative 0 %   Eosinophils Absolute 0.0 0 - 0.7 K/uL   Basophils Relative 1 %   Basophils Absolute 0.1 0 - 0.1 K/uL  Troponin I     Status: Abnormal    Collection Time: 06/14/15  9:39 PM  Result Value Ref Range   Troponin I 0.05 (H) <0.031 ng/mL    Comment: READ BACK AND VERIFIED ANDREA BRYANT 06/15/2015 0048 [facility]        PERSISTENTLY INCREASED TROPONIN VALUES IN THE RANGE OF 0.04-0.49 ng/mL CAN BE SEEN IN:       -UNSTABLE ANGINA       -CONGESTIVE HEART FAILURE       -MYOCARDITIS       -CHEST TRAUMA       -ARRYHTHMIAS       -LATE PRESENTING MYOCARDIAL INFARCTION       -COPD   CLINICAL FOLLOW-UP RECOMMENDED.   Glucose, capillary     Status: Abnormal   Collection Time: 06/14/15  9:41 PM  Result Value Ref Range   Glucose-Capillary 232 (H) 65 - 99 mg/dL  Urinalysis complete, with microscopic ([facility] only)     Status: Abnormal   Collection Time: 06/15/15  1:24 AM  Result Value Ref Range   Color, Urine YELLOW (A) YELLOW   APPearance HAZY (A) CLEAR   Glucose, UA >500 (A) NEGATIVE mg/dL   Bilirubin Urine NEGATIVE NEGATIVE   Ketones, ur NEGATIVE NEGATIVE mg/dL   Specific Gravity, Urine 1.017 1.005 - 1.030   Hgb urine dipstick NEGATIVE NEGATIVE   pH 5.0 5.0 - 8.0   Protein, ur NEGATIVE NEGATIVE mg/dL   Nitrite NEGATIVE NEGATIVE   Leukocytes, UA 2+ (A) NEGATIVE   RBC / HPF 0-5 0 - 5 RBC/hpf   WBC, UA 6-30 0 - 5 WBC/hpf   Bacteria, UA MANY (A) NONE SEEN   Squamous Epithelial / LPF 0-5 (A) NONE SEEN   Dg Abd Acute W/chest  06/15/2015   CLINICAL DATA:  Abdominal pain.  EXAM: DG ABDOMEN ACUTE W/ 1V CHEST  COMPARISON:  03/21/2009  FINDINGS: There is no evidence of dilated bowel loops or free intraperitoneal air. No radiopaque calculi or other significant radiographic abnormality is seen. There is generous colonic stool volume. Heart size and mediastinal contours are within normal limits. Both lungs are clear.  IMPRESSION: Negative abdominal radiographs. No acute cardiopulmonary disease. Generous colonic stool volume.   Electronically Signed   By: Andreas Newport M.D.   On: 06/15/2015 01:18   Dg Hip Unilat With Pelvis 2-3 Views  Left  06/14/2015   CLINICAL DATA:  Left hip pain.  EXAM: DG HIP (WITH OR WITHOUT PELVIS) 2-3V LEFT  COMPARISON:  None.  FINDINGS: Negative for fracture, dislocation or radiopaque foreign body. Minimal arthritic changes are present. There is no bone lesion or bony destruction.  IMPRESSION: Negative.   Electronically Signed   By: Andreas Newport M.D.   On: 06/14/2015 22:13    Review of Systems  Unable to perform ROS: patient nonverbal    Blood pressure 143/77, pulse 101, temperature 98.2 F (36.8 C), temperature source Oral, resp. rate 16, height 5' 9"  (1.753 m), weight 70.308 kg (155 lb), SpO2 99 %. Physical Exam  Nursing note and vitals reviewed. Constitutional: She is oriented to person, place, and time. She appears well-developed and well-nourished. No distress.  HENT:  Head: Normocephalic and atraumatic.  Mouth/Throat: Oropharynx is clear and moist.  Eyes: EOM are normal. Pupils are equal, round, and reactive to light. No scleral icterus.  Neck: Normal range of motion. Neck supple. No JVD present. No tracheal deviation present. No thyromegaly present.  Cardiovascular: Normal rate, regular rhythm and normal heart sounds.  Exam reveals no gallop and no friction rub.   No murmur heard. Respiratory: Effort normal and breath sounds normal.  GI: Soft. Bowel sounds are normal. She exhibits no distension. There is no tenderness.  Genitourinary:  Deferred  Musculoskeletal: Normal range of motion. She exhibits no edema.  Lymphadenopathy:    She has no cervical adenopathy.  Neurological: She is alert and oriented to person, place, and time. No cranial nerve deficit. She exhibits normal muscle tone.  Skin: Skin is warm and dry. No rash noted. No erythema.  Psychiatric: She has a normal mood and affect. Her behavior is normal. Judgment and thought content normal.     Assessment/Plan 74 year old African-American female admitted for urinary tract infection and sepsis. 1. Urinary tract  infection: The patient is received Rocephin in the emergency department. We will obtain urine cultures and we will follow for sensitivities and adjust antibiotic coverage as necessary. 2. Sepsis: The patient meets criteria via tachycardia and leukocytosis. Her blood pressure is good. Blood cultures have been obtained, albeit after antibiotics. 3. Diabetes mellitus type II: Continue basal insulin coverage adjusted for hospital diet. I will also place patient on sliding scale insulin. 4. Hypertension: Continue Norvasc and clonidine 5. Hyperlipidemia: Continue simvastatin 6. DVT prophylaxis: Heparin 7. GI prophylaxis: None The patient is a full code. Time spent on admission orders and patient care approximately 35 minutes  Harrie Foreman 06/15/2015, 4:38 AM

## 2015-06-15 NOTE — Care Management Note (Signed)
Case Management Note  Patient Details  Name: Kari Carr MRN: [id number] Date of Birth: 14-Oct-1941  Subjective/Objective:                  Met with patient's husband; patient was sleeping. He states that she is baseline hard of hearing but "unable to make healthcare decisions due to lack of mental capacity" per her husband. He states she was able to walk up until last Friday. He feels that she can ride in a personal vehicle to home. She is followed by [facility] in the home. They have provided a hospital bed. Per Santiago Glad with [facility] patient is a DNR but showing as FULL code here.  Husband states he has help from her siblings. He denies RNCM needs at this time.   Action/Plan: I have notified Santiago Glad with [facility] hospice of patient admission. I will also create EMS packet just in case it is needed at time of discharge- please follow address changes. RNCM contact card supplied to patient's husband.   Expected Discharge Date:                  Expected Discharge Plan:     In-House Referral:     Discharge planning Services  CM Consult  Post Acute Care Choice:    Choice offered to:  Patient  DME Arranged:  N/A DME Agency:     HH Arranged:  NA HH Agency:  [facility]  Status of Service:  In process, will continue to follow  Medicare Important Message Given:    Date Medicare IM Given:    Medicare IM give by:    Date Additional Medicare IM Given:    Additional Medicare Important Message give by:     If discussed at [place] of Stay Meetings, dates discussed:    Additional Comments:  Marshell Garfinkel, RN 06/15/2015, 8:47 AM

## 2015-06-15 NOTE — Progress Notes (Signed)
Patient bladder scan per MD order, bladder scan result >999, Dr.Walsh notified, order obtained to place foley cath, foley placed- 14 fr, patient tolerated procedure well.  Will cont to monitor the pt.

## 2015-06-15 NOTE — Progress Notes (Addendum)
Spoke with Dr.Walsh regarding patient needing a speech consult- holding food and med's in mouth

## 2015-06-15 NOTE — Progress Notes (Signed)
[facility] [place] [facility]   PATIENT NAME: Kari Carr    MR#:  [id number]  DATE OF BIRTH:  [birth year]  SUBJECTIVE:  CHIEF COMPLAINT:   Chief Complaint  Patient presents with  . Hip Pain  . Abdominal Pain   Nonverbal. Seems to have significant abdominal pain.  REVIEW OF SYSTEMS:   ROS unable to obtain as the patient is nonverbal  DRUG ALLERGIES:  No Known Allergies  VITALS:  Blood pressure 154/78, pulse 84, temperature 96.6 F (35.9 C), temperature source Axillary, resp. rate 18, height  (1.753 m), weight 65.953 kg (145 lb 6.4 oz), SpO2 98 %.  PHYSICAL EXAMINATION:  GENERAL:  74 y.o.-year-old patient lying in the bed with no acute distress.  EYES: Pupils equal, round, reactive to light and accommodation. No scleral icterus. Extraocular muscles intact.  HEENT: Head atraumatic, normocephalic. Oropharynx and nasopharynx clear. Mucous membranes are moist NECK:  Supple, no jugular venous distention. No thyroid enlargement, no tenderness.  LUNGS: Normal breath sounds bilaterally, no wheezing, rales,rhonchi or crepitation. No use of accessory muscles of respiration.  CARDIOVASCULAR: S1, S2 normal. No murmurs, rubs, or gallops.  ABDOMEN: Lower abdomen is distended and tender. Bowel sounds present. Guarding and grimacing with palpation.  EXTREMITIES: No pedal edema, cyanosis, or clubbing.  NEUROLOGIC: Cranial nerves II through XII are grossly intact. She does not participate in the rest of the neurologic examination  PSYCHIATRIC: The patient is alert but nonverbal, does not follow my commands  SKIN: No obvious rash, lesion, or ulcer.    LABORATORY PANEL:   CBC  Recent Labs Lab 06/14/15 2139  WBC 14.1*  HGB 15.3  HCT 48.0*  PLT 221   ------------------------------------------------------------------------------------------------------------------  Chemistries   Recent Labs Lab 06/14/15 2139  NA 150*  K 4.5  CL 110  CO2 28   GLUCOSE 246*  BUN 51*  CREATININE 1.83*  CALCIUM 10.2  AST 30  ALT 22  ALKPHOS 93  BILITOT 0.7   ------------------------------------------------------------------------------------------------------------------  Cardiac Enzymes  Recent Labs Lab 06/14/15 2139  TROPONINI 0.05*   ------------------------------------------------------------------------------------------------------------------  RADIOLOGY:  Dg Abd Acute W/chest  06/15/2015   CLINICAL DATA:  Abdominal pain.  EXAM: DG ABDOMEN ACUTE W/ 1V CHEST  COMPARISON:  03/21/2009  FINDINGS: There is no evidence of dilated bowel loops or free intraperitoneal air. No radiopaque calculi or other significant radiographic abnormality is seen. There is generous colonic stool volume. Heart size and mediastinal contours are within normal limits. Both lungs are clear.  IMPRESSION: Negative abdominal radiographs. No acute cardiopulmonary disease. Generous colonic stool volume.   Electronically Signed   By: Ellery Plunk M.D.   On: 06/15/2015 01:18   Dg Hip Unilat With Pelvis 2-3 Views Left  06/14/2015   CLINICAL DATA:  Left hip pain.  EXAM: DG HIP (WITH OR WITHOUT PELVIS) 2-3V LEFT  COMPARISON:  None.  FINDINGS: Negative for fracture, dislocation or radiopaque foreign body. Minimal arthritic changes are present. There is no bone lesion or bony destruction.  IMPRESSION: Negative.   Electronically Signed   By: Ellery Plunk M.D.   On: 06/14/2015 22:13    EKG:   Orders placed or performed during the hospital encounter of 06/15/15  . ED EKG  . ED EKG  . EKG 12-Lead  . EKG 12-Lead  . EKG 12-Lead  . EKG 12-Lead    ASSESSMENT AND PLAN:   #1 urinary tract infection - Urine and blood culture ordered, pending. Last culture is of a pansensitive Escherichia  coli in July. Continue with Rocephin  #2 urinary retention - Abdomen distended and tender. This is likely the reason for her lack of ambulation and oral intake over the past few  days. A Foley catheter is now placed abdomen decompressed  #3 hypernatremia - Recheck serum sodium, continue hydration  #4 acute on chronic kidney disease stage III - Likely due to decreased oral intake as well as urinary retention - Continue hydration  #5 advanced dementia - Lives at home with her husband. She is ambulatory. She is nonverbal and requires his assistance for most ADLs - Seems to be at baseline - Will get speech therapy and physical therapy consultations prior to discharge  #6 diabetes mellitus type 2 - Blood sugars controlled, continue Lantus 20 units, sliding scale  #7 hypertension - Fairly well controlled continue clonidine, lisinopril hydrochlorothiazide, amlodipine  #8 hyperlipidemia - Continue statin  All the records are reviewed and case discussed with Care Management/Social Workerr. Management plans discussed with the patient and her husband as well as nursing.  CODE STATUS: DO NOT RESUSCITATE. This patient is followed by hospice   TOTAL TIME TAKING CARE OF THIS PATIENT: 35 minutes.  Greater than 50% of time spent in care coordination and counseling. POSSIBLE D/C IN 1-2 DAYS, DEPENDING ON CLINICAL CONDITION.   Elby Showers M.D on 06/15/2015 at 1:56 PM  Between 7am to 6pm - Pager - [phone]  After 6pm go to [email or web address] - password [email or web address] [facility]  [facility]  Office  [phone]  CC: Primary care physician; Inc [facility]

## 2015-06-15 NOTE — Progress Notes (Addendum)
Paged Dr. Anne Hahn per patient blood sugar 129 not administering Lantus. Hold PO meds and monitor blood pressure, notify MD if blood pressure not WNL sbp160/100 .  Teaching giving to family on patients risk of aspiration. Nursing will continue to monitor.

## 2015-06-15 NOTE — ED Provider Notes (Signed)
[facility] Emergency Department Provider Note  ____________________________________________  Time seen: Approximately 12:12 AM  I have reviewed the triage vital signs and the nursing notes.   HISTORY  Chief Complaint Hip Pain and Abdominal Pain  History limited by patient's nonverbal status Majority of history obtained by patient's family  HPI Kari Carr is a 74 y.o. female who presents to the ED from home with a chief complain of abdominal pain. Patient has a medical history significant for dementia with baseline minimal verbal state. Spouse reports patient has had a decline in her baseline health 4 days. States patient was expressing and making gestures of having left-sided abdominal pain. Denies fever, chills, cough, vomiting, diarrhea. Family states patient's PO appetite has decreased over the past several days. Last BM this afternoon; previously patient's stool was hard, round with traces of bright red blood. Nothing seems to make the pain better or worse.   Past Medical History  Diagnosis Date  . Dementia   . Hypertension   . Diabetes mellitus without complication   . Hearing deficit     There are no active problems to display for this patient.   Past Surgical History  Procedure Laterality Date  . Abdominal hysterectomy      No current outpatient prescriptions on file.  Allergies Review of patient's allergies indicates no known allergies.  History reviewed. No pertinent family history.  Social History Social History  Substance Use Topics  . Smoking status: Never Smoker   . Smokeless tobacco: None  . Alcohol Use: No    Review of Systems Constitutional: No fever/chills Eyes: No visual changes. ENT: No sore throat. Cardiovascular: Denies chest pain. Respiratory: Denies shortness of breath. Gastrointestinal: Positive for abdominal pain.  No nausea, no vomiting.  No diarrhea.  Positive for constipation. Genitourinary: Positive  for dark urine. Negative for dysuria. Musculoskeletal: Negative for back pain. Skin: Negative for rash. Neurological: Negative for headaches, focal weakness or numbness.  10-point ROS otherwise negative.  ____________________________________________   PHYSICAL EXAM:  VITAL SIGNS: ED Triage Vitals  Enc Vitals Group     BP 06/14/15 2125 122/95 mmHg     Pulse Rate 06/14/15 2125 114     Resp 06/14/15 2125 18     Temp 06/14/15 2125 98.2 F (36.8 C)     Temp Source 06/14/15 2125 Oral     SpO2 06/14/15 2125 98 %     Weight 06/14/15 2125 155 lb (70.308 kg)     Height 06/14/15 2125  (1.753 m)     Head Cir --      Peak Flow --      Pain Score --      Pain Loc --      Pain Edu? --      Excl. in GC? --     Constitutional: Alert and oriented. Well appearing and in no acute distress. Baseline nonverbal status Eyes: Conjunctivae are normal. PERRL. EOMI. Head: Atraumatic. Nose: No congestion/rhinnorhea. Mouth/Throat: Mucous membranes are mildly dry.  Oropharynx non-erythematous. Neck: No stridor.   Cardiovascular: Normal rate, regular rhythm. Grossly normal heart sounds.  Good peripheral circulation. Respiratory: Normal respiratory effort.  No retractions. Lungs CTAB. Gastrointestinal: Soft, minimally tender to suprapubic area without rebound or guarding. No distention. Decreased bowel sounds. No abdominal bruits. No CVA tenderness. Musculoskeletal: No lower extremity tenderness nor edema.  No joint effusions. Neurologic:  Baseline minimally verbal status. Follow simple commands. No gross focal neurologic deficits are appreciated.  Skin:  Skin is  warm, dry and intact. No rash noted. Psychiatric: Mood and affect are normal. Speech and behavior are normal.  ____________________________________________   LABS (all labs ordered are listed, but only abnormal results are displayed)  Labs Reviewed  LIPASE, BLOOD - Abnormal; Notable for the following:    Lipase 13 (*)    All other  components within normal limits  COMPREHENSIVE METABOLIC PANEL - Abnormal; Notable for the following:    Sodium 150 (*)    Glucose, Bld 246 (*)    BUN 51 (*)    Creatinine, Ser 1.83 (*)    GFR calc non Af Amer 26 (*)    GFR calc Af Amer 31 (*)    All other components within normal limits  CBC WITH DIFFERENTIAL/PLATELET - Abnormal; Notable for the following:    WBC 14.1 (*)    HCT 48.0 (*)    MCHC 31.9 (*)    RDW 14.7 (*)    Neutro Abs 10.5 (*)    Monocytes Absolute 1.1 (*)    All other components within normal limits  GLUCOSE, CAPILLARY - Abnormal; Notable for the following:    Glucose-Capillary 232 (*)    All other components within normal limits  URINALYSIS COMPLETEWITH MICROSCOPIC ([facility] ONLY) - Abnormal; Notable for the following:    Color, Urine YELLOW (*)    APPearance HAZY (*)    Glucose, UA >500 (*)    Leukocytes, UA 2+ (*)    Bacteria, UA MANY (*)    Squamous Epithelial / LPF 0-5 (*)    All other components within normal limits  TROPONIN I - Abnormal; Notable for the following:    Troponin I 0.05 (*)    All other components within normal limits   ____________________________________________  EKG  ED ECG REPORT I, Senna Lape J, the attending physician, personally viewed and interpreted this ECG.   Date: 06/15/2015  EKG Time: 0220  Rate: 101  Rhythm: sinus tachycardia  Axis: Normal  Intervals:none  ST&T Change: Nonspecific  ____________________________________________  RADIOLOGY  Abdomen acute with chest (viewed by me, interpreted per Dr. Clovis Riley): Negative abdominal radiographs. No acute cardiopulmonary disease. Generous colonic stool volume.  Left hip (viewed by me, interpreted per Dr. Clovis Riley): Negative.   ____________________________________________   PROCEDURES  Procedure(s) performed: None  Critical Care performed: No  ____________________________________________   INITIAL IMPRESSION / ASSESSMENT AND PLAN / ED COURSE  Pertinent  labs & imaging results that were available during my care of the patient were reviewed by me and considered in my medical decision making (see chart for details).  74 year old female brought by family for complaints of abdominal pain and decreased appetite. Laboratory results thus far remarkable for elevated sodium and renal function consistent with dehydration. Will initiate IV fluid resuscitation. Obtain plain film abdominal x-rays, in and out catheter for urinalysis and reassess.  ----------------------------------------- 2:10 AM on 06/15/2015 -----------------------------------------  Updated patient and family of laboratory, urinalysis and imaging results. Will discuss with hospitalist for evaluation in the ED for admission for generalized weakness in the setting of UTI and positive troponin. ____________________________________________   FINAL CLINICAL IMPRESSION(S) / ED DIAGNOSES  Final diagnoses:  Lower abdominal pain  Weakness  Elevated troponin  UTI (lower urinary tract infection)      Irean Hong, MD 06/15/15 0222

## 2015-06-15 NOTE — Progress Notes (Signed)
Speech Therapy Note: received order, reviewed chart notes, then consulted NSG re: pt's status. Pt is pocketing meds crushed in puree per NSG report. D/t pt's baseline Dementia and current impact from infection(UTI), and pt's oral phase deficits(pocketing), rec. Holding Meal and meds tonight and explore use of IV meds. Rec. Oral care for hygiene and stim. ST will f/u tomorrow am for BSE and toleration of an oral diet. NSG agreed.

## 2015-06-15 NOTE — ED Notes (Signed)
Blood culture draw #2: RIGHT AC 

## 2015-06-15 NOTE — Progress Notes (Signed)
Paged MD to verify order for fluids, per Dr. Anne Hahn follow order giving by Dr. Clent Ridges. Fluids should not affect lab levels. Please refer to labs. Nursing will continue to monitor.

## 2015-06-15 NOTE — Progress Notes (Signed)
Visit made. Patient is followed at home by [facility] with a diagnosis of dementia. She is a DNR. She was brought to the ED via private motor vehicle by her husband for evaluation of abdominal pain. In the ED she was found to have a UTI and admitted for treatment. Prior to writers visit this morning patient had a bladder scan done by staff RN Pricilla Larsson that showed greater >999 cc, patient now has a foley catheter in place. CMRN Marylene Land and Staff RN Pricilla Larsson made aware patient is currently followed by hospice at home. Patient seen lying in bed, eyes closed, facial muscles and extremities relaxed, appears comfortable. IVF infusing at 100 cc an hour. Patient's husband Ivar Drape and son present at bedside. Ivar Drape expressed his frustration regarding the wait yesterday in the ED. Emotional support offered. Patient is currently receiving IV antibiotics for treatment of a urinary tract infection. Ivar Drape reports that she is continuing to need more encouragement taking her oral medications. She did take her medications this morning crushed with applesauce. Patient did open her eyes briefly and say hello. Ivar Drape reports she is minimally verbal at baseline. Will continue to follow through final disposition. Thank you. Dayna Barker RN, BSN, [facility] [place], [facility] [phone] c

## 2015-06-15 NOTE — ED Notes (Signed)
Pt taken to room 10 via w/c; pt alert but nonverbal; pt transferred to exam stretcher with assistance from family (x3 assist); pt with difficulty following commands, shuffling feet, difficulty standing; Dr Dolores Frame to room and report given to care nurse

## 2015-06-15 NOTE — ED Notes (Signed)
Blood culture draw #1: LEFT AC 

## 2015-06-16 LAB — GLUCOSE, CAPILLARY
Glucose-Capillary: 285 mg/dL — ABNORMAL HIGH (ref 65–99)
Glucose-Capillary: 361 mg/dL — ABNORMAL HIGH (ref 65–99)
Glucose-Capillary: 423 mg/dL — ABNORMAL HIGH (ref 65–99)
Glucose-Capillary: 151 mg/dL — ABNORMAL HIGH (ref 65–99)
Glucose-Capillary: 235 mg/dL — ABNORMAL HIGH (ref 65–99)

## 2015-06-16 LAB — BASIC METABOLIC PANEL
Anion gap: 9 (ref 5–15)
BUN: 50 mg/dL — AB (ref 6–20)
CO2: 27 mmol/L (ref 22–32)
Calcium: 9.4 mg/dL (ref 8.9–10.3)
Chloride: 116 mmol/L — ABNORMAL HIGH (ref 101–111)
Creatinine, Ser: 1.64 mg/dL — ABNORMAL HIGH (ref 0.44–1.00)
GFR calc Af Amer: 35 mL/min — ABNORMAL LOW (ref >60)
GFR, EST NON AFRICAN AMERICAN: 30 mL/min — AB (ref >60)
Glucose, Bld: 188 mg/dL — ABNORMAL HIGH (ref 65–99)
POTASSIUM: 4.2 mmol/L (ref 3.5–5.1)
SODIUM: 152 mmol/L — AB (ref 135–145)

## 2015-06-16 LAB — CBC
HEMATOCRIT: 39.8 % (ref 35.0–47.0)
Hemoglobin: 13.1 g/dL (ref 12.0–16.0)
MCH: 30.5 pg (ref 26.0–34.0)
MCHC: 33 g/dL (ref 32.0–36.0)
MCV: 92.5 fL (ref 80.0–100.0)
PLATELETS: 159 10*3/uL (ref 150–440)
RBC: 4.3 MIL/uL (ref 3.80–5.20)
RDW: 14.3 % (ref 11.5–14.5)
WBC: 10 10*3/uL (ref 3.6–11.0)

## 2015-06-16 MED ORDER — POLYETHYLENE GLYCOL 3350 17 G PO PACK
17.0000 g | PACK | Freq: Every day | ORAL | Status: DC
  Administered 2015-06-16: 17 g via ORAL
  Filled 2015-06-16: qty 1

## 2015-06-16 MED ORDER — ENOXAPARIN SODIUM 40 MG/0.4ML ~~LOC~~ SOLN
40.0000 mg | SUBCUTANEOUS | Status: DC
  Administered 2015-06-16 – 2015-06-17 (×2): 40 mg via SUBCUTANEOUS
  Filled 2015-06-16 (×2): qty 0.4

## 2015-06-16 NOTE — Progress Notes (Signed)
ANTICOAGULATION CONSULT NOTE - Initial Consult  Pharmacy Consult for Lovenox Indication: VTE prophylaxis  No Known Allergies  Patient Measurements: Height:  (175.3 cm) Weight: 156 lb 8 oz (70.988 kg) IBW/kg (Calculated) : 66.2 Heparin Dosing Weight:   Vital Signs: Temp: 97.9 F (36.6 C) (09/01 1542) Temp Source: Oral (09/01 1542) BP: 116/58 mmHg (09/01 1542) Pulse Rate: 89 (09/01 1542)  Labs:  Recent Labs  06/14/15 2139 06/15/15 1403 06/15/15 1520 06/16/15 0639  HGB 15.3  --  14.4 13.1  HCT 48.0*  --  45.4 39.8  PLT 221  --  142* 159  CREATININE 1.83* 1.47*  --  1.64*  TROPONINI 0.05*  --   --   --     Estimated Creatinine Clearance: 32.4 mL/min (by C-G formula based on Cr of 1.64).   Medical History: Past Medical History  Diagnosis Date  . Dementia   . Hypertension   . Diabetes mellitus without complication   . Hearing deficit     Medications:  Prescriptions prior to admission  Medication Sig Dispense Refill Last Dose  . amLODipine (NORVASC) 5 MG tablet Take 1 tablet by mouth daily.   unknown at unknown  . cloNIDine (CATAPRES) 0.1 MG tablet Take 0.1 mg by mouth 2 (two) times daily.   unknown at unknown  . LANTUS 100 UNIT/ML injection Inject 28 Units as directed daily.   unknown at unknown  . lisinopril-hydrochlorothiazide (PRINZIDE,ZESTORETIC) 20-12.5 MG per tablet Take 1 tablet by mouth daily.   unknown at unknown  . simvastatin (ZOCOR) 20 MG tablet Take 20 mg by mouth daily.   unknown at unknown    Assessment: CrCl = 32.4 ml/min  Goal of Therapy:  DVT prophylaxis   Plan:  Lovenox 30 mg SQ Q24H originally ordered.  Will adjust dose to lovenox 40 mg SQ Q24H based on CrCl > 30 ml/min.  Ysidra Sopher D 06/16/2015,7:01 PM

## 2015-06-16 NOTE — Progress Notes (Signed)
Visit made. Patient seen sitting up in bed, much more alert and some what interactive with Clinical research associate. She said a few words and smiled. Husband and son present at bedside. Per chart note review, discussion with family and staff RN Selena Batten, pateint did take her oral medications this morning, ate some breakfast and has had miralax for constipation. She remains on IV antibiotics and IVF, urine culture pending. Patient has had a physical therapy evaluation this morning, she did not get out of bed, per family, PT to return for a further evaluation. Patient's husband Ivar Drape did say that PT recommended bed side commode and a walker, writer to order through hospice triage. Will continue to follow through final disposition. Dayna Barker, RN, BSN, [facility] [place], [facility] [phone] c

## 2015-06-16 NOTE — Progress Notes (Signed)
Patient nonverbal.  Speech swallow eval Dys I diet.  Patient is very slow to take crushed pills with applesauce.  Need to devote enough time to ensure that patient swallows her meds and food.  Family present at bedside this shift.  No complaints of pain.

## 2015-06-16 NOTE — Progress Notes (Signed)
Patient ID: Kari Carr, female   DOB: [birth year], 74 y.o.   MRN: [id number] [facility] PROGRESS NOTE  PCP: [facility]  HPI/Subjective: Patient did not answer any questions. She did squeeze my hand to command and moves her feet. As per husband, since Friday she has not been doing well less responsive and very weak and not eating very much.  Objective: Filed Vitals:   06/16/15 0726  BP: 139/71  Pulse: 92  Temp: 98.3 F (36.8 C)  Resp: 17    Filed Weights   06/14/15 2125 06/15/15 0535 06/16/15 0436  Weight: 70.308 kg (155 lb) 65.953 kg (145 lb 6.4 oz) 70.988 kg (156 lb 8 oz)    ROS: Review of Systems  Unable to perform ROS  secondary to dementia  Exam: Physical Exam  HENT:  Nose: No mucosal edema.  Mouth/Throat: No oropharyngeal exudate or posterior oropharyngeal edema.  Eyes: Conjunctivae and lids are normal. Pupils are equal, round, and reactive to light.  Neck: No JVD present. Carotid bruit is not present. No edema present. No thyroid mass and no thyromegaly present.  Cardiovascular: S1 normal and S2 normal.  Exam reveals no gallop.   Murmur heard.  Systolic murmur is present with a grade of 2/6  Pulses:      Dorsalis pedis pulses are 2+ on the right side, and 2+ on the left side.  Respiratory: No respiratory distress. She has no wheezes. She has no rhonchi. She has no rales.  GI: Soft. Bowel sounds are normal. There is tenderness in the suprapubic area.  Musculoskeletal:       Right ankle: She exhibits no swelling.       Left ankle: She exhibits no swelling.  Lymphadenopathy:    She has no cervical adenopathy.  Neurological: She is alert.  Movement feet and arms on her own. Squeeze my hand to command.  Skin: Skin is warm. No rash noted. Nails show no clubbing.  Psychiatric:  Did not answer any of my questions    Data Reviewed: Basic Metabolic Panel:  Recent Labs Lab 06/14/15 2139 06/15/15 1403 06/16/15 0639   NA 150* 152* 152*  K 4.5 4.5 4.2  CL 110 118* 116*  CO2 28 25 27   GLUCOSE 246* 161* 188*  BUN 51* 53* 50*  CREATININE 1.83* 1.47* 1.64*  CALCIUM 10.2 9.7 9.4   Liver Function Tests:  Recent Labs Lab 06/14/15 2139  AST 30  ALT 22  ALKPHOS 93  BILITOT 0.7  PROT 7.9  ALBUMIN 3.5    Recent Labs Lab 06/14/15 2139  LIPASE 13*   CBC:  Recent Labs Lab 06/14/15 2139 06/15/15 1520 06/16/15 0639  WBC 14.1* 10.2 10.0  NEUTROABS 10.5*  --   --   HGB 15.3 14.4 13.1  HCT 48.0* 45.4 39.8  MCV 92.9 93.9 92.5  PLT 221 142* 159   Cardiac Enzymes:  Recent Labs Lab 06/14/15 2139  TROPONINI 0.05*    CBG:  Recent Labs Lab 06/15/15 0443 06/15/15 0744 06/15/15 1133 06/15/15 1625 06/15/15 2038  GLUCAP 285* 204* 171* 147* 129*    Recent Results (from the past 240 hour(s))  Culture, blood (routine x 2)     Status: None (Preliminary result)   Collection Time: 06/15/15  3:36 AM  Result Value Ref Range Status   Specimen Description BLOOD LEFT ANTECUBITAL  Final   Special Requests   Final    BOTTLES DRAWN AEROBIC AND ANAEROBIC 5MLAEROBIC,1MLANAEROBIC   Culture NO  GROWTH < 12 HOURS  Final   Report Status PENDING  Incomplete  Culture, blood (routine x 2)     Status: None (Preliminary result)   Collection Time: 06/15/15  3:57 AM  Result Value Ref Range Status   Specimen Description BLOOD RIGHT ANTECUBITAL  Final   Special Requests   Final    BOTTLES DRAWN AEROBIC AND ANAEROBIC ,   Culture NO GROWTH < 12 HOURS  Final   Report Status PENDING  Incomplete     Studies: Dg Abd Acute W/chest  06/15/2015   CLINICAL DATA:  Abdominal pain.  EXAM: DG ABDOMEN ACUTE W/ 1V CHEST  COMPARISON:  03/21/2009  FINDINGS: There is no evidence of dilated bowel loops or free intraperitoneal air. No radiopaque calculi or other significant radiographic abnormality is seen. There is generous colonic stool volume. Heart size and mediastinal contours are within normal  limits. Both lungs are clear.  IMPRESSION: Negative abdominal radiographs. No acute cardiopulmonary disease. Generous colonic stool volume.   Electronically Signed   By: Ellery Plunk M.D.   On: 06/15/2015 01:18   Dg Hip Unilat With Pelvis 2-3 Views Left  06/14/2015   CLINICAL DATA:  Left hip pain.  EXAM: DG HIP (WITH OR WITHOUT PELVIS) 2-3V LEFT  COMPARISON:  None.  FINDINGS: Negative for fracture, dislocation or radiopaque foreign body. Minimal arthritic changes are present. There is no bone lesion or bony destruction.  IMPRESSION: Negative.   Electronically Signed   By: Ellery Plunk M.D.   On: 06/14/2015 22:13    Scheduled Meds: . amLODipine  5 mg Oral Daily  . cefTRIAXone (ROCEPHIN)  IV  1 g Intravenous Q24H  . cloNIDine  0.1 mg Oral BID  . docusate sodium  100 mg Oral BID  . enoxaparin (LOVENOX) injection  30 mg Subcutaneous Q24H  . feeding supplement (ENSURE ENLIVE)  237 mL Oral TID WC  . lisinopril  20 mg Oral Daily   And  . hydrochlorothiazide  12.5 mg Oral Daily  . insulin aspart  0-15 Units Subcutaneous TID WC  . insulin glargine  20 Units Subcutaneous QHS  . polyethylene glycol  17 g Oral Daily  . simvastatin  20 mg Oral Daily  . sodium chloride  3 mL Intravenous Q12H   Continuous Infusions: . sodium chloride 50 mL/hr at 06/15/15 2213    Assessment/Plan:  1. Clinical sepsis, acute cystitis without hematuria. I'm still waiting for urine culture results patient is on IV Rocephin. Blood cultures negative so far. 2. Urinary retention. Since creatinine is elevated I'm unable to do a CT scan with contrast. I will treat constipation since x-ray showed constipation. She was she is still tender over the suprapubic area I will not take out the catheter today. I will hopefully try to take out the catheter tomorrow. 3. Constipation and MiraLAX 4. Essential hypertension- continue usual medications 5. Diabetes type 2 without complication continue Lantus and aspart  insulin 6. Dementia 7. Weakness will get physical therapy evaluation.  Code Status:     Code Status Orders        Start     Ordered   06/15/15 0909  Do not attempt resuscitation (DNR)   Continuous    Question Answer Comment  In the event of cardiac or respiratory ARREST Do not call a "code blue"   In the event of cardiac or respiratory ARREST Do not perform Intubation, CPR, defibrillation or ACLS   In the event of cardiac or respiratory ARREST Use medication  by any route, position, wound care, and other measures to relive pain and suffering. May use oxygen, suction and manual treatment of airway obstruction as needed for comfort.      06/15/15 0908    Advance Directive Documentation        Most Recent Value   Type of Advance Directive  Out of facility DNR (pink MOST or yellow form)   Pre-existing out of facility DNR order (yellow form or pink MOST form)     "MOST" Form in Place?       Family Communication: Husband at the bedside Disposition Plan: To be determined depending on physical therapy evaluation  Antibiotics:  Rocephin  Time spent: 25 minutes  Alford Highland  [facility]

## 2015-06-16 NOTE — Evaluation (Signed)
Physical Therapy Evaluation Patient Details Name: Kari Carr MRN: [id number] DOB: Sep 23, 1941 Today's Date: 06/16/2015   History of Present Illness  Pt is a 74 yo female who presents to the hospital with abdominal pain and weakness over the past 5 days. She was admitted to the hospital for a UTI.   Clinical Impression  Pt presents with hx of dementia, HTN, DM, and hearing difficulties. Examination reveals that pt can participate marginally with therapy in simple tasks such as gripping therapist's fingers. She is pleasantly confused during therapy tasks. All history obtained from husband and son. Due to pt's PLOF and current inability to ambulate she will benefit from skilled PT to address her strength and activity tolerance deficits. Pt has good family support from husband and son.     Follow Up Recommendations Home health PT    Equipment Recommendations  Rolling walker with 5" wheels;3in1 (PT)    Recommendations for Other Services       Precautions / Restrictions Precautions Precautions: Fall Precaution Comments: Pt presents with severe dementia  Restrictions Weight Bearing Restrictions: No      Mobility  Bed Mobility Overal bed mobility:  (Not able to assess due to cognition. Pt refusal)                Transfers                 General transfer comment: Pt cognitively impaired. Not interested in getting out of bed.   Ambulation/Gait             General Gait Details: Pt cognitively impaired. Not willing to attempt  Stairs            Wheelchair Mobility    Modified Rankin (Stroke Patients Only)       Balance Overall balance assessment: History of Falls (2 falls in the past 6 months)                                           Pertinent Vitals/Pain Pain Assessment:  (Not able to communicate)    Home Living Family/patient expects to be discharged to:: Private residence Living Arrangements: Spouse/significant  other Available Help at Discharge: Home health;Family;Available 24 hours/day Type of Home: House Home Access: Level entry     Home Layout: One level Home Equipment: None Additional Comments: HH comes in to help with bathing in the evening    Prior Function Level of Independence: Independent         Comments: Pt was able to perform community ambulation independently     Hand Dominance        Extremity/Trunk Assessment   Upper Extremity Assessment: Difficult to assess due to impaired cognition           Lower Extremity Assessment: Difficult to assess due to impaired cognition (Pt has functional strength to move full ROM, UE and LE)         Communication   Communication:  (Cognitive impairment )  Cognition Arousal/Alertness: Awake/alert Behavior During Therapy: Flat affect Overall Cognitive Status: History of cognitive impairments - at baseline                      General Comments      Exercises        Assessment/Plan    PT Assessment Patient needs continued PT services  PT Diagnosis Generalized weakness;Difficulty  walking   PT Problem List Decreased strength;Decreased activity tolerance;Decreased safety awareness;Decreased knowledge of precautions;Decreased cognition  PT Treatment Interventions DME instruction;Gait training;Functional mobility training;Stair training;Therapeutic activities;Therapeutic exercise;Balance training;Neuromuscular re-education;Patient/family education;Wheelchair mobility training   PT Goals (Current goals can be found in the Care Plan section) Acute Rehab PT Goals Patient Stated Goal: None stated PT Goal Formulation: With family Time For Goal Achievement: 06/30/15 Potential to Achieve Goals: Fair    Frequency Min 2X/week   Barriers to discharge        Co-evaluation               End of Session   Activity Tolerance:  (limited by cognitive impairment ) Patient left: in bed;with call bell/phone within  reach;with bed alarm set;with family/visitor present;with SCD's reapplied Nurse Communication: Mobility status         Time: 9604-5409 PT Time Calculation (min) (ACUTE ONLY): 21 min   Charges:         PT G CodesBenna Dunks 2015-06-18, 1:04 PM  Benna Dunks, SPT. [phone]

## 2015-06-16 NOTE — Progress Notes (Signed)
Spoke with Dr. Renae Gloss regarding elevated Troponin's from 06/14/2015.  Advised no further evaluation is needed or repeat labs for that.

## 2015-06-16 NOTE — Evaluation (Addendum)
Clinical/Bedside Swallow Evaluation Patient Details  Name: Kari Carr MRN: [id number] Date of Birth: 27-Jan-1941  Today's Date: 06/16/2015 Time:        Past Medical History:  Past Medical History  Diagnosis Date  . Dementia   . Hypertension   . Diabetes mellitus without complication   . Hearing deficit    Past Surgical History:  Past Surgical History  Procedure Laterality Date  . Abdominal hysterectomy     HPI:  Pt is a 74 y/o female w/ h/o Dmentia, HTN, DM and HOH. Pt has been c/o abdominal pain; upon admission urinary retention was noted as well as UTI. Pt requires assistance w/ ADLs at home; Husband is the caregiver. Pt is currently on a regular diet ordered by MD but has been demo. pocketing and oral holding per NSG report.    Assessment / Plan / Recommendation Clinical Impression  Pt appeared to safely tolerate trials of thin liquids and purees w/ no overt s/s of aspiration noted; oral phase appeared adequate in strength and ROM for bolus management and A-P transfer for swallowing. She exhibited adequate oral clearing b/t trials w/ consistencies given; food/liquids was alternated to aid clearing. Pt required feeding assistance and was hampered by her Cognitive status in receiving boluses. She required much encouragement and was able to assist in feeding herself and this appeared to make her more comfortable. Husband was a great support assisting her. She was mod-max. assist w/ the oral feeding and intake overall was minimal amounts at a time. Would not recommend food of increased texture at this time d/t the demand of mastication and the demand of Cognitive attention to such food; pt would be at risk for oral phase deficits. Rec. a Dys. 1(puree) and thin liquids; strict aspiration precautions; meds in Puree - crushed as able. Add condiments for flavor and taste; moisture for easier swallowing. Feeding assistance at all meals; time and encouragement. ST will f/u next 1-2 days. Husband  given education/information.     Aspiration Risk   mild sec. to Cognitive decline   Diet Recommendation  Dys. 1(puree); thin liquids       Other  Recommendations  strict aspiration precautions; Meds in puree - Crushed as able. Feeding assistance at all meals.   Follow Up Recommendations   Dietician f/u for nutritional supplements; ST to check toleration of diet next 1-3 days.    Frequency and Duration  3x week; x1 week     Pertinent Vitals/Pain denied    SLP Swallow Goals  see care plan   Swallow Study Prior Functional Status       General Date of Onset: 06/15/15 Other Pertinent Information: Pt is a 74 y/o female w/ h/o Dmentia, HTN, DM and HOH. Pt has been c/o abdominal pain; upon admission urinary retention was noted as well as UTI. Pt requires assistance w/ ADLs at home; Husband is the caregiver. Pt is currently on a regular diet ordered by MD but has been demo. pocketing and oral holding per NSG report.  Type of Study: Bedside swallow evaluation Previous Swallow Assessment: none Diet Prior to this Study: Regular;Thin liquids (has not been eating as much at home per Husband) Temperature Spikes Noted: No (wbc 10.0) Respiratory Status: Room air History of Recent Intubation: No Behavior/Cognition: Alert;Confused;Distractible;Requires cueing;Doesn't follow directions Oral Cavity - Dentition: Missing dentition Self-Feeding Abilities: Needs assist;Total assist;Refused PO (at times refused) Patient Positioning: Upright in bed Baseline Vocal Quality:  (mostly aphonic sec. to Cognitive status; mumbled speech) Volitional Cough: Cognitively  unable to elicit Volitional Swallow: Unable to elicit    Oral/Motor/Sensory Function Overall Oral Motor/Sensory Function:  (could not complete OM assessment sec. to Cognition)   Ice Chips Ice chips: Within functional limits Presentation: Spoon (fed: 2 trials) Other Comments: appeared wfl but hampered by Cognitive stauts in receiving boluses    Thin Liquid Thin Liquid: Within functional limits Presentation: Straw;Self Fed (assisted: 6-7 trials) Other Comments: appeared wfl but hampered by Cognitive status in receiving boluses    Nectar Thick Nectar Thick Liquid: Not tested   Honey Thick Honey Thick Liquid: Not tested   Puree Puree: Within functional limits Presentation: Spoon;Self Fed (assisted: 10 trials) Other Comments: appeared wfl but hampered by Cognitive stauts in receiving boluses   Solid   GO    Solid: Not tested      Jerilynn Som, MS, CCC-SLP  Jontue Crumpacker 06/16/2015,9:58 AM

## 2015-06-17 LAB — BASIC METABOLIC PANEL
ANION GAP: 7 (ref 5–15)
BUN: 40 mg/dL — AB (ref 6–20)
CALCIUM: 9.2 mg/dL (ref 8.9–10.3)
CO2: 27 mmol/L (ref 22–32)
CREATININE: 1.52 mg/dL — AB (ref 0.44–1.00)
Chloride: 112 mmol/L — ABNORMAL HIGH (ref 101–111)
GFR calc Af Amer: 38 mL/min — ABNORMAL LOW (ref >60)
GFR, EST NON AFRICAN AMERICAN: 33 mL/min — AB (ref >60)
GLUCOSE: 218 mg/dL — AB (ref 65–99)
Potassium: 3.8 mmol/L (ref 3.5–5.1)
Sodium: 146 mmol/L — ABNORMAL HIGH (ref 135–145)

## 2015-06-17 LAB — GLUCOSE, CAPILLARY
Glucose-Capillary: 213 mg/dL — ABNORMAL HIGH (ref 65–99)
Glucose-Capillary: 230 mg/dL — ABNORMAL HIGH (ref 65–99)
Glucose-Capillary: 157 mg/dL — ABNORMAL HIGH (ref 65–99)

## 2015-06-17 LAB — URINE CULTURE
Culture: >=100000
Special Requests: NORMAL

## 2015-06-17 MED ORDER — INSULIN GLARGINE 100 UNIT/ML ~~LOC~~ SOLN
25.0000 [IU] | Freq: Every day | SUBCUTANEOUS | Status: DC
  Administered 2015-06-17: 25 [IU] via SUBCUTANEOUS
  Filled 2015-06-17 (×2): qty 0.25

## 2015-06-17 MED ORDER — LACTULOSE 10 GM/15ML PO SOLN
30.0000 g | Freq: Once | ORAL | Status: AC
  Administered 2015-06-17: 30 g via ORAL
  Filled 2015-06-17: qty 60

## 2015-06-17 NOTE — Progress Notes (Signed)
Patient ID: Kari Carr, female   DOB: [birth year], 74 y.o.   MRN: [id number] [facility] PROGRESS NOTE  PCP: [facility]  HPI/Subjective: Patient said a few words to me today. Did not follow any commands.  Objective: Filed Vitals:   06/17/15 1058  BP: 127/62  Pulse: 81  Temp: 97.7 F (36.5 C)  Resp: 18    Filed Weights   06/15/15 0535 06/16/15 0436 06/17/15 0223  Weight: 65.953 kg (145 lb 6.4 oz) 70.988 kg (156 lb 8 oz) 68.221 kg (150 lb 6.4 oz)    ROS: Review of Systems  Unable to perform ROS  secondary to dementia  Exam: Physical Exam  HENT:  Nose: No mucosal edema.  Mouth/Throat: No oropharyngeal exudate or posterior oropharyngeal edema.  Eyes: Conjunctivae and lids are normal. Pupils are equal, round, and reactive to light.  Neck: No JVD present. Carotid bruit is not present. No edema present. No thyroid mass and no thyromegaly present.  Cardiovascular: S1 normal and S2 normal.  Exam reveals no gallop.   Murmur heard.  Systolic murmur is present with a grade of 2/6  Pulses:      Dorsalis pedis pulses are 2+ on the right side, and 2+ on the left side.  Respiratory: No respiratory distress. She has no wheezes. She has no rhonchi. She has no rales.  GI: Soft. Bowel sounds are normal. There is no tenderness.  Musculoskeletal:       Right ankle: She exhibits no swelling.       Left ankle: She exhibits no swelling.  Lymphadenopathy:    She has no cervical adenopathy.  Neurological: She is alert.  Moved her arms on her own  Skin: Skin is warm. No rash noted. Nails show no clubbing.  Psychiatric:  Talked a few words to me today    Data Reviewed: Basic Metabolic Panel:  Recent Labs Lab 06/14/15 2139 06/15/15 1403 06/16/15 0639 06/17/15 0423  NA 150* 152* 152* 146*  K 4.5 4.5 4.2 3.8  CL 110 118* 116* 112*  CO2 28 25 27 27   GLUCOSE 246* 161* 188* 218*  BUN 51* 53* 50* 40*  CREATININE 1.83* 1.47* 1.64* 1.52*   CALCIUM 10.2 9.7 9.4 9.2   Liver Function Tests:  Recent Labs Lab 06/14/15 2139  AST 30  ALT 22  ALKPHOS 93  BILITOT 0.7  PROT 7.9  ALBUMIN 3.5    Recent Labs Lab 06/14/15 2139  LIPASE 13*   CBC:  Recent Labs Lab 06/14/15 2139 06/15/15 1520 06/16/15 0639  WBC 14.1* 10.2 10.0  NEUTROABS 10.5*  --   --   HGB 15.3 14.4 13.1  HCT 48.0* 45.4 39.8  MCV 92.9 93.9 92.5  PLT 221 142* 159   Cardiac Enzymes:  Recent Labs Lab 06/14/15 2139  TROPONINI 0.05*    CBG:  Recent Labs Lab 06/16/15 1103 06/16/15 1109 06/16/15 1626 06/16/15 2104 06/17/15 1118  GLUCAP 423* 361* 151* 235* 213*    Recent Results (from the past 240 hour(s))  Urine culture     Status: None   Collection Time: 06/15/15  1:24 AM  Result Value Ref Range Status   Specimen Description URINE, CATHETERIZED  Final   Special Requests Normal  Final   Culture >=100,000 COLONIES/mL GRAM POSITIVE RODS  Final   Report Status 06/17/2015 FINAL  Final  Culture, blood (routine x 2)     Status: None (Preliminary result)   Collection Time: 06/15/15  3:36 AM  Result Value Ref Range Status   Specimen Description BLOOD LEFT ANTECUBITAL  Final   Special Requests   Final    BOTTLES DRAWN AEROBIC AND ANAEROBIC 5MLAEROBIC,1MLANAEROBIC   Culture NO GROWTH 2 DAYS  Final   Report Status PENDING  Incomplete  Culture, blood (routine x 2)     Status: None (Preliminary result)   Collection Time: 06/15/15  3:57 AM  Result Value Ref Range Status   Specimen Description BLOOD RIGHT ANTECUBITAL  Final   Special Requests   Final    BOTTLES DRAWN AEROBIC AND ANAEROBIC ,   Culture NO GROWTH 2 DAYS  Final   Report Status PENDING  Incomplete      Scheduled Meds: . amLODipine  5 mg Oral Daily  . cefTRIAXone (ROCEPHIN)  IV  1 g Intravenous Q24H  . cloNIDine  0.1 mg Oral BID  . docusate sodium  100 mg Oral BID  . enoxaparin (LOVENOX) injection  40 mg Subcutaneous Q24H  . feeding supplement  (ENSURE ENLIVE)  237 mL Oral TID WC  . lisinopril  20 mg Oral Daily   And  . hydrochlorothiazide  12.5 mg Oral Daily  . insulin aspart  0-15 Units Subcutaneous TID WC  . insulin glargine  20 Units Subcutaneous QHS  . polyethylene glycol  17 g Oral Daily  . simvastatin  20 mg Oral Daily  . sodium chloride  3 mL Intravenous Q12H   Continuous Infusions: . sodium chloride 50 mL/hr at 06/15/15 2213    Assessment/Plan:  1. Clinical sepsis, acute cystitis without hematuria. Urine culture- growing gram positive rods which is likely contaminant. I'll continue Rocephin while here. 2. Urinary retention. Try to remove Foley catheter today and see if she urinates. Hopefully I can send home tomorrow without a catheter. 3. Constipation and MiraLAX 4. Essential hypertension- continue usual medications 5. Diabetes type 2 without complication continue Lantus and aspart insulin 6. Dementia 7. Weakness - pt rec home  Code Status:     Code Status Orders        Start     Ordered   06/15/15 0909  Do not attempt resuscitation (DNR)   Continuous    Question Answer Comment  In the event of cardiac or respiratory ARREST Do not call a "code blue"   In the event of cardiac or respiratory ARREST Do not perform Intubation, CPR, defibrillation or ACLS   In the event of cardiac or respiratory ARREST Use medication by any route, position, wound care, and other measures to relive pain and suffering. May use oxygen, suction and manual treatment of airway obstruction as needed for comfort.      06/15/15 0908    Advance Directive Documentation        Most Recent Value   Type of Advance Directive  Out of facility DNR (pink MOST or yellow form)   Pre-existing out of facility DNR order (yellow form or pink MOST form)     "MOST" Form in Place?       Family Communication: Husband at the bedside Disposition Plan: Home tomorrow  Antibiotics:  Rocephin  Time spent: 22 minutes  Alford Highland  [facility]

## 2015-06-17 NOTE — Progress Notes (Signed)
Follow up on current hospice patient.  No plans for discharge today.  Plan to remove foley cath and see if patient is able to urinate.  If unable too, will re-insert foley and discharge home tomorrow with foley catheter.  Updated records faxed to triage.  Agency will continue to follow through final disposition.  Norris Cross, RN Clinical Nurse Liaison [facility]

## 2015-06-17 NOTE — Progress Notes (Signed)
Nutrition Follow-up  DOCUMENTATION CODES:   Severe malnutrition in context of acute illness/injury  INTERVENTION:   Meals and Snacks: Cater to patient preferences, following SLP consistency recommendations. Provide much encouragement at meal times. Medical Food Supplement Therapy: Recommend continuing Ensure Enlive at this point instead of switching to Glucerna. Pt is only taking bites and sips of meals. Ensure Enlive has more calories and more protein than Glucerna. Can recommend Glucerna if pt po intake improved. Also RD discussed insulin regimen with Nsg to cover elevated BS as pt with poor po intake. RD spoke with husband yesterday and today regarding thus.   NUTRITION DIAGNOSIS:   Inadequate oral intake related to acute illness as evidenced by per patient/family report  GOAL:   Patient will meet greater than or equal to 90% of their needs; ongoing  MONITOR:    (Energy Intake, Digestive System, Anthropometrics, Electrolyte and renal Profile)   ASSESSMENT:   Pt admitted with sepsis with UTI. Pt with h/o dementia, minimally verbal.   Diet Order:  DIET - DYS 1 Room service appropriate?: No; Fluid consistency:: Thin   Current Nutrition: Pt ate 2 bites of eggs and tooks sips of Ensure Enlive this am and per husband then fell back asleep. Pt husband reports pt ate bites and sips last night as well.    Gastrointestinal Profile: Last BM: 06/15/2015   Medications: colace, novolog, colace, lantus, lactulose, miralax  Electrolyte/Renal Profile and Glucose Profile:   Recent Labs Lab 06/15/15 1403 06/16/15 0639 06/17/15 0423  NA 152* 152* 146*  K 4.5 4.2 3.8  CL 118* 116* 112*  CO2 BUN 53* 50* 40*  CREATININE 1.47* 1.64* 1.52*  CALCIUM 9.7 9.4 9.2  GLUCOSE 161* 188* 218*   Protein Profile:  Recent Labs Lab 06/14/15 2139  ALBUMIN 3.5      Weight Trend since Admission: Filed Weights   06/15/15 0535 06/16/15 0436 06/17/15 0223  Weight: 145 lb 6.4 oz  (65.953 kg) 156 lb 8 oz (70.988 kg) 150 lb 6.4 oz (68.221 kg)     Skin:  Reviewed, no issues   BMI:  Body mass index is 22.2 kg/(m^2).  Estimated Nutritional Needs:   Kcal:  BEE: 1233kcals, TEE: (IF 1.1-1.3)(AF 1.2) 1628-1904kcals  Protein:  53-65g protein (0.8-1.0g/kg)   Fluid:  1648-1932mL of fluid (25-51mL/kg)  EDUCATION NEEDS:   No education needs identified at this time   HIGH Care Level  Leda Quail, RD, LDN Pager [phone]

## 2015-06-17 NOTE — Progress Notes (Signed)
Pt has yet to void. Bladder scan showed approx. 525 mL urine. Prime doc Betti Cruz paged. Will cont to monitor output at this time. Pt awake, drinking cranberry juice. No acute distress noted. Will cont to monitor.

## 2015-06-18 LAB — GLUCOSE, CAPILLARY
Glucose-Capillary: 309 mg/dL — ABNORMAL HIGH (ref 65–99)
Glucose-Capillary: 166 mg/dL — ABNORMAL HIGH (ref 65–99)

## 2015-06-18 MED ORDER — CEPHALEXIN 250 MG PO CAPS: 250.0000 mg | ORAL_CAPSULE | Freq: Three times a day (TID) | ORAL | Status: AC

## 2015-06-18 MED ORDER — DOCUSATE SODIUM 100 MG PO CAPS: 100.0000 mg | ORAL_CAPSULE | Freq: Two times a day (BID) | ORAL | Status: AC

## 2015-06-18 NOTE — Discharge Summary (Signed)
[facility] - [place] at [facility]   PATIENT NAME: Kari Carr    MR#:  [id number]  DATE OF BIRTH:  07-May-1941  DATE OF ADMISSION:  06/15/2015 ADMITTING PHYSICIAN: Arnaldo Natal, MD  DATE OF DISCHARGE: 06/18/2015  PRIMARY CARE PHYSICIAN: [facility]    ADMISSION DIAGNOSIS:  Weakness [R53.1] UTI (lower urinary tract infection) [N39.0] Elevated troponin [R79.89] Lower abdominal pain [R10.30]  DISCHARGE DIAGNOSIS:  Active Problems:   Sepsis   Protein-calorie malnutrition, severe   SECONDARY DIAGNOSIS:   Past Medical History  Diagnosis Date  . Dementia   . Hypertension   . Diabetes mellitus without complication   . Hearing deficit     HOSPITAL COURSE:   1. Urinary retention- patient required Foley catheter on presentation secondary to urinary retention. I tried to discontinue the Foley catheter and see if the patient will urinate. Unfortunately the patient retained urine again and will have to go home with a Foley catheter. Since the patient is followed by hospice I think we can keep the Foley catheter in and change it every 3 weeks. Unable to do a CAT scan with contrast because of chronic kidney disease.  2. Suspected sepsis on presentation with acute cystitis without hematuria. Urine culture grew out gram-positive rods which is likely contaminant. Empirically treated with Rocephin and Keflex upon discharge. 3. Essential hypertension usual medications were continued 4. Diabetes type 2 without complication- Lantus continued 5. Likely dementia, does not talk much. 6. Hyperlipidemia unspecified continue simvastatin. 7. Severe protein calorie malnutrition- patient does not eat very much. Recommend ensure. 8. Chronic kidney disease stage III-   DISCHARGE CONDITIONS:   Fair  CONSULTS OBTAINED:  None  DRUG ALLERGIES:  No Known Allergies  DISCHARGE MEDICATIONS:   Current Discharge Medication List    START  taking these medications   Details  cephALEXin (KEFLEX) 250 MG capsule Take 1 capsule (250 mg total) by mouth 3 (three) times daily. Qty: 15 capsule, Refills: 0    docusate sodium (COLACE) 100 MG capsule Take 1 capsule (100 mg total) by mouth 2 (two) times daily. Qty: 60 capsule, Refills: 0      CONTINUE these medications which have NOT CHANGED   Details  amLODipine (NORVASC) 5 MG tablet Take 1 tablet by mouth daily.    cloNIDine (CATAPRES) 0.1 MG tablet Take 0.1 mg by mouth 2 (two) times daily.    LANTUS 100 UNIT/ML injection Inject 28 Units as directed daily.    lisinopril-hydrochlorothiazide (PRINZIDE,ZESTORETIC) 20-12.5 MG per tablet Take 1 tablet by mouth daily.    simvastatin (ZOCOR) 20 MG tablet Take 20 mg by mouth daily.         DISCHARGE INSTRUCTIONS:   Follow-up PMD one week Consider urology as outpatient  If you experience worsening of your admission symptoms, develop shortness of breath, life threatening emergency, suicidal or homicidal thoughts you must seek medical attention immediately by calling 911 or calling your MD immediately  if symptoms less severe.  You Must read complete instructions/literature along with all the possible adverse reactions/side effects for all the Medicines you take and that have been prescribed to you. Take any new Medicines after you have completely understood and accept all the possible adverse reactions/side effects.   Please note  You were cared for by a hospitalist during your hospital stay. If you have any questions about your discharge medications or the care you received while you were in the hospital after you are discharged, you can  call the unit and asked to speak with the hospitalist on call if the hospitalist that took care of you is not available. Once you are discharged, your primary care physician will handle any further medical issues. Please note that NO REFILLS for any discharge medications will be authorized once you  are discharged, as it is imperative that you return to your primary care physician (or establish a relationship with a primary care physician if you do not have one) for your aftercare needs so that they can reassess your need for medications and monitor your lab values.    Today   CHIEF COMPLAINT:   Chief Complaint  Patient presents with  . Hip Pain  . Abdominal Pain    HISTORY OF PRESENT ILLNESS:  Kari Carr  is a 74 y.o. female with a known history of hypertension, diabetes resulted with abdominal pain and hip pain. She suspected to have sepsis and also had urinary retention.   VITAL SIGNS:  Blood pressure 127/59, pulse 75, temperature 98 F (36.7 C), temperature source Oral, resp. rate 16, height 5\' 9"  (1.753 m), weight 68.221 kg (150 lb 6.4 oz), SpO2 100 %.    PHYSICAL EXAMINATION:  GENERAL:  74 y.o.-year-old patient lying in the bed with no acute distress.  EYES: Pupils equal, round, reactive to light and accommodation. No scleral icterus. Extraocular muscles intact.  HEENT: Head atraumatic, normocephalic. Oropharynx and nasopharynx clear.  NECK:  Supple, no jugular venous distention. No thyroid enlargement, no tenderness.  LUNGS: Normal breath sounds bilaterally, no wheezing, rales,rhonchi or crepitation. No use of accessory muscles of respiration.  CARDIOVASCULAR: S1, S2 normal. No murmurs, rubs, or gallops.  ABDOMEN: Soft, tenderness in the lower abdomen over the bladder. Bowel sounds present. No organomegaly or mass.  EXTREMITIES: Trace edema, no cyanosis, or clubbing.  NEUROLOGIC: Cranial nerves difficult to assess secondary to dementia PSYCHIATRIC: The patient is alert. SKIN: No obvious rash, lesion, or ulcer.   DATA REVIEW:   CBC  Recent Labs Lab 06/16/15 0639  WBC 10.0  HGB 13.1  HCT 39.8  PLT 159    Chemistries   Recent Labs Lab 06/14/15 2139  06/17/15 0423  NA 150*  < > 146*  K 4.5  < > 3.8  CL 110  < > 112*  CO2 28  < > 27  GLUCOSE 246*   < > 218*  BUN 51*  < > 40*  CREATININE 1.83*  < > 1.52*  CALCIUM 10.2  < > 9.2  AST 30  --   --   ALT 22  --   --   ALKPHOS 93  --   --   BILITOT 0.7  --   --   < > = values in this interval not displayed.  Cardiac Enzymes  Recent Labs Lab 06/14/15 2139  TROPONINI 0.05*    Microbiology Results  Results for orders placed or performed during the hospital encounter of 06/15/15  Urine culture     Status: None   Collection Time: 06/15/15  1:24 AM  Result Value Ref Range Status   Specimen Description URINE, CATHETERIZED  Final   Special Requests Normal  Final   Culture >=100,000 COLONIES/mL GRAM POSITIVE RODS  Final   Report Status 06/17/2015 FINAL  Final  Culture, blood (routine x 2)     Status: None (Preliminary result)   Collection Time: 06/15/15  3:36 AM  Result Value Ref Range Status   Specimen Description BLOOD LEFT ANTECUBITAL  Final   Special  Requests   Final    BOTTLES DRAWN AEROBIC AND ANAEROBIC 5MLAEROBIC,1MLANAEROBIC   Culture NO GROWTH 3 DAYS  Final   Report Status PENDING  Incomplete  Culture, blood (routine x 2)     Status: None (Preliminary result)   Collection Time: 06/15/15  3:57 AM  Result Value Ref Range Status   Specimen Description BLOOD RIGHT ANTECUBITAL  Final   Special Requests   Final    BOTTLES DRAWN AEROBIC AND ANAEROBIC ,   Culture NO GROWTH 3 DAYS  Final   Report Status PENDING  Incomplete    Management plans discussed with the patient, family and they are in agreement.  CODE STATUS:     Code Status Orders        Start     Ordered   06/15/15 0909  Do not attempt resuscitation (DNR)   Continuous    Question Answer Comment  In the event of cardiac or respiratory ARREST Do not call a "code blue"   In the event of cardiac or respiratory ARREST Do not perform Intubation, CPR, defibrillation or ACLS   In the event of cardiac or respiratory ARREST Use medication by any route, position, wound care, and other measures to  relive pain and suffering. May use oxygen, suction and manual treatment of airway obstruction as needed for comfort.      06/15/15 0908    Advance Directive Documentation        Most Recent Value   Type of Advance Directive  Out of facility DNR (pink MOST or yellow form)   Pre-existing out of facility DNR order (yellow form or pink MOST form)     "MOST" Form in Place?        TOTAL TIME TAKING CARE OF THIS PATIENT: 35 minutes.    Alford Highland M.D on 06/18/2015 at 10:12 AM  Between 7am to 6pm - Pager - [phone]  After 6pm go to [email or web address] - password [email or web address] [facility]  [place] [facility]  Office  [phone]  CC: Primary care physician; [facility]

## 2015-06-18 NOTE — Care Management Note (Signed)
Case Management Note  Patient Details  Name: GERLENE GLASSBURN MRN: [id number] Date of Birth: 19-Feb-1941  Subjective/Objective:         Call to Burna Mortimer at [facility] and [facility] [place] per Ms Gossett is being discharged to her home today. Awaiting a call-back from [place]..            Action/Plan:   Expected Discharge Date:                  Expected Discharge Plan:     In-House Referral:     Discharge planning Services  CM Consult  Post Acute Care Choice:    Choice offered to:  Patient  DME Arranged:  N/A DME Agency:     HH Arranged:  NA HH Agency:  [facility]  Status of Service:  In process, will continue to follow  Medicare Important Message Given:    Date Medicare IM Given:    Medicare IM give by:    Date Additional Medicare IM Given:    Additional Medicare Important Message give by:     If discussed at Long Length of Stay Meetings, dates discussed:    Additional Comments:  Sabrie Moritz A, RN 06/18/2015, 10:22 AM

## 2015-06-18 NOTE — Discharge Instructions (Signed)
Hospice to change foley every three weeks Family to drain foley bag every six hours

## 2015-06-20 LAB — CULTURE, BLOOD (ROUTINE X 2)
Culture: NO GROWTH
Culture: NO GROWTH

## 2015-06-22 ENCOUNTER — Observation Stay
Admission: EM | Admit: 2015-06-22 | Discharge: 2015-06-23 | Disposition: A | Discharge status: Home / Self Care | Payer: Medicare Other | Attending: Internal Medicine | Admitting: Internal Medicine

## 2015-06-22 DIAGNOSIS — I129 Hypertensive chronic kidney disease with stage 1 through stage 4 chronic kidney disease, or unspecified chronic kidney disease: Secondary | ICD-10-CM | POA: Diagnosis not present

## 2015-06-22 DIAGNOSIS — E1169 Type 2 diabetes mellitus with other specified complication: Secondary | ICD-10-CM

## 2015-06-22 DIAGNOSIS — Z90710 Acquired absence of both cervix and uterus: Secondary | ICD-10-CM | POA: Diagnosis not present

## 2015-06-22 DIAGNOSIS — I10 Essential (primary) hypertension: Secondary | ICD-10-CM

## 2015-06-22 DIAGNOSIS — R339 Retention of urine, unspecified: Secondary | ICD-10-CM

## 2015-06-22 DIAGNOSIS — K625 Hemorrhage of anus and rectum: Secondary | ICD-10-CM | POA: Diagnosis not present

## 2015-06-22 DIAGNOSIS — H9190 Unspecified hearing loss, unspecified ear: Secondary | ICD-10-CM | POA: Diagnosis not present

## 2015-06-22 DIAGNOSIS — K922 Gastrointestinal hemorrhage, unspecified: Secondary | ICD-10-CM | POA: Diagnosis present

## 2015-06-22 DIAGNOSIS — Z79899 Other long term (current) drug therapy: Secondary | ICD-10-CM | POA: Insufficient documentation

## 2015-06-22 DIAGNOSIS — E785 Hyperlipidemia, unspecified: Secondary | ICD-10-CM

## 2015-06-22 DIAGNOSIS — Z6822 Body mass index (BMI) 22.0-22.9, adult: Secondary | ICD-10-CM | POA: Insufficient documentation

## 2015-06-22 DIAGNOSIS — N183 Chronic kidney disease, stage 3 unspecified: Secondary | ICD-10-CM

## 2015-06-22 DIAGNOSIS — Z794 Long term (current) use of insulin: Secondary | ICD-10-CM | POA: Insufficient documentation

## 2015-06-22 DIAGNOSIS — F0390 Unspecified dementia without behavioral disturbance: Secondary | ICD-10-CM

## 2015-06-22 DIAGNOSIS — E43 Unspecified severe protein-calorie malnutrition: Secondary | ICD-10-CM | POA: Insufficient documentation

## 2015-06-22 DIAGNOSIS — E669 Obesity, unspecified: Secondary | ICD-10-CM

## 2015-06-22 DIAGNOSIS — E1122 Type 2 diabetes mellitus with diabetic chronic kidney disease: Secondary | ICD-10-CM | POA: Diagnosis not present

## 2015-06-22 LAB — ABO/RH: ABO/RH(D): A POS

## 2015-06-22 LAB — COMPREHENSIVE METABOLIC PANEL
ALT: 31 U/L (ref 14–54)
ANION GAP: 16 — AB (ref 5–15)
AST: 38 U/L (ref 15–41)
Albumin: 3.1 g/dL — ABNORMAL LOW (ref 3.5–5.0)
Alkaline Phosphatase: 87 U/L (ref 38–126)
BUN: 22 mg/dL — ABNORMAL HIGH (ref 6–20)
CHLORIDE: 94 mmol/L — AB (ref 101–111)
CO2: 27 mmol/L (ref 22–32)
CREATININE: 1.25 mg/dL — AB (ref 0.44–1.00)
Calcium: 9.3 mg/dL (ref 8.9–10.3)
GFR, EST AFRICAN AMERICAN: 49 mL/min — AB (ref >60)
GFR, EST NON AFRICAN AMERICAN: 42 mL/min — AB (ref >60)
Glucose, Bld: 177 mg/dL — ABNORMAL HIGH (ref 65–99)
Potassium: 3.7 mmol/L (ref 3.5–5.1)
SODIUM: 137 mmol/L (ref 135–145)
Total Bilirubin: 0.9 mg/dL (ref 0.3–1.2)
Total Protein: 7.1 g/dL (ref 6.5–8.1)

## 2015-06-22 LAB — GLUCOSE, CAPILLARY
GLUCOSE-CAPILLARY: 162 mg/dL — AB (ref 65–99)
Glucose-Capillary: 148 mg/dL — ABNORMAL HIGH (ref 65–99)

## 2015-06-22 LAB — HEMOGLOBIN
HEMOGLOBIN: 14.1 g/dL (ref 12.0–16.0)
HEMOGLOBIN: 13.4 g/dL (ref 12.0–16.0)

## 2015-06-22 LAB — CBC WITH DIFFERENTIAL/PLATELET
BASOS PCT: 1 %
Basophils Absolute: 0.1 10*3/uL (ref 0–0.1)
EOS ABS: 0.1 10*3/uL (ref 0–0.7)
Eosinophils Relative: 1 %
HEMATOCRIT: 44.6 % (ref 35.0–47.0)
HEMOGLOBIN: 15 g/dL (ref 12.0–16.0)
LYMPHS ABS: 2 10*3/uL (ref 1.0–3.6)
Lymphocytes Relative: 21 %
MCH: 30.2 pg (ref 26.0–34.0)
MCHC: 33.5 g/dL (ref 32.0–36.0)
MCV: 90.2 fL (ref 80.0–100.0)
Monocytes Absolute: 0.8 10*3/uL (ref 0.2–0.9)
Monocytes Relative: 8 %
NEUTROS ABS: 6.7 10*3/uL — AB (ref 1.4–6.5)
NEUTROS PCT: 69 %
Platelets: 218 10*3/uL (ref 150–440)
RBC: 4.95 MIL/uL (ref 3.80–5.20)
RDW: 13.6 % (ref 11.5–14.5)
WBC: 9.8 10*3/uL (ref 3.6–11.0)

## 2015-06-22 LAB — PROTIME-INR
INR: 0.97
PROTHROMBIN TIME: 13.1 s (ref 11.4–15.0)

## 2015-06-22 LAB — PREPARE RBC (CROSSMATCH)

## 2015-06-22 MED ORDER — ONDANSETRON HCL 4 MG/2ML IJ SOLN: 4.0000 mg | Freq: Four times a day (QID) | INTRAMUSCULAR | Status: DC | PRN

## 2015-06-22 MED ORDER — DOCUSATE SODIUM 100 MG PO CAPS
100.0000 mg | ORAL_CAPSULE | Freq: Two times a day (BID) | ORAL | Status: DC
  Administered 2015-06-22 – 2015-06-23 (×2): 100 mg via ORAL
  Filled 2015-06-22 (×2): qty 1

## 2015-06-22 MED ORDER — INFLUENZA VAC SPLIT QUAD 0.5 ML IM SUSY
0.5000 mL | PREFILLED_SYRINGE | INTRAMUSCULAR | Status: AC
  Administered 2015-06-23: 0.5 mL via INTRAMUSCULAR
  Filled 2015-06-22: qty 0.5

## 2015-06-22 MED ORDER — SODIUM CHLORIDE 0.9 % IV SOLN
10.0000 mL/h | Freq: Once | INTRAVENOUS | Status: DC
Start: 2015-06-22 — End: 2015-06-22

## 2015-06-22 MED ORDER — INSULIN ASPART 100 UNIT/ML ~~LOC~~ SOLN
0.0000 [IU] | Freq: Three times a day (TID) | SUBCUTANEOUS | Status: DC
  Administered 2015-06-22: 1 [IU] via SUBCUTANEOUS
  Administered 2015-06-23 (×2): 2 [IU] via SUBCUTANEOUS
  Filled 2015-06-22: qty 2
  Filled 2015-06-22 (×2): qty 1
  Filled 2015-06-22: qty 2

## 2015-06-22 MED ORDER — ONDANSETRON HCL 4 MG PO TABS: 4.0000 mg | ORAL_TABLET | Freq: Four times a day (QID) | ORAL | Status: DC | PRN

## 2015-06-22 MED ORDER — INSULIN ASPART 100 UNIT/ML ~~LOC~~ SOLN
3.0000 [IU] | Freq: Three times a day (TID) | SUBCUTANEOUS | Status: DC
  Administered 2015-06-22 – 2015-06-23 (×3): 3 [IU] via SUBCUTANEOUS
  Filled 2015-06-22 (×3): qty 3

## 2015-06-22 MED ORDER — INSULIN GLARGINE 100 UNIT/ML ~~LOC~~ SOLN
28.0000 [IU] | Freq: Every day | SUBCUTANEOUS | Status: DC
  Filled 2015-06-22 (×2): qty 0.28

## 2015-06-22 MED ORDER — INSULIN ASPART 100 UNIT/ML ~~LOC~~ SOLN: 0.0000 [IU] | Freq: Every day | SUBCUTANEOUS | Status: DC

## 2015-06-22 MED ORDER — SODIUM CHLORIDE 0.9 % IJ SOLN
3.0000 mL | Freq: Two times a day (BID) | INTRAMUSCULAR | Status: DC
  Administered 2015-06-22 – 2015-06-23 (×3): 3 mL via INTRAVENOUS

## 2015-06-22 MED ORDER — CLONIDINE HCL 0.1 MG PO TABS
0.1000 mg | ORAL_TABLET | Freq: Two times a day (BID) | ORAL | Status: DC
  Administered 2015-06-22 – 2015-06-23 (×2): 0.1 mg via ORAL
  Filled 2015-06-22 (×2): qty 1

## 2015-06-22 MED ORDER — SIMVASTATIN 20 MG PO TABS
20.0000 mg | ORAL_TABLET | Freq: Every day | ORAL | Status: DC
  Administered 2015-06-23: 20 mg via ORAL
  Filled 2015-06-22: qty 1

## 2015-06-22 MED ORDER — LISINOPRIL 20 MG PO TABS
20.0000 mg | ORAL_TABLET | Freq: Every day | ORAL | Status: DC
  Administered 2015-06-23: 20 mg via ORAL
  Filled 2015-06-22: qty 1

## 2015-06-22 MED ORDER — LISINOPRIL-HYDROCHLOROTHIAZIDE 20-12.5 MG PO TABS: 1.0000 | ORAL_TABLET | Freq: Every day | ORAL | Status: DC

## 2015-06-22 MED ORDER — HYDROCHLOROTHIAZIDE 12.5 MG PO CAPS
12.5000 mg | ORAL_CAPSULE | Freq: Every day | ORAL | Status: DC
  Administered 2015-06-23: 12.5 mg via ORAL
  Filled 2015-06-22: qty 1

## 2015-06-22 MED ORDER — AMLODIPINE BESYLATE 5 MG PO TABS
5.0000 mg | ORAL_TABLET | Freq: Every day | ORAL | Status: DC
  Administered 2015-06-23: 5 mg via ORAL
  Filled 2015-06-22: qty 1

## 2015-06-22 MED ORDER — SODIUM CHLORIDE 0.9 % IV SOLN
INTRAVENOUS | Status: DC
  Administered 2015-06-22: 15:00:00 via INTRAVENOUS

## 2015-06-22 NOTE — Progress Notes (Signed)
Called Dr Winona Legato to clarify blood transfusion orders. Dr stated to not transfuse now, but to ensure that blood bank had available units of blood ready for pt, and to d/c order to transfuse now; blood bank contacted, and bloodbank stated that there were units ready for pt and that those units would be available for 3 days

## 2015-06-22 NOTE — ED Provider Notes (Signed)
[facility] Emergency Department Provider Note  ____________________________________________  Time seen: Approximately 12:19 PM  I have reviewed the triage vital signs and the nursing notes.   HISTORY  Chief Complaint Rectal Bleeding  history is limited by patient's dementia and the fact that she does not speak or otherwise communicate.   HPI Kari Carr is a 74 y.o. female patient is a noncommunicative person with dementia who was recently discharged from the hospital with UTI. Patient was found to be having red blood per rectum today. Patient does not appear to be in any distress is unable to communicate with me. Does not wince on palpation of the abdomen. Family is here wants H wants her to be transfused if necessary although she is a no code.  Past Medical History  Diagnosis Date  . Dementia   . Hypertension   . Diabetes mellitus without complication   . Hearing deficit     Patient Active Problem List   Diagnosis Date Noted  . Rectal bleeding 06/22/2015  . Rectal bleeding 06/22/2015  . CKD (chronic kidney disease), stage III 06/22/2015  . Hypertension 06/22/2015  . Urinary retention 06/22/2015  . Dementia 06/22/2015  . Diabetes mellitus type 2 in obese 06/22/2015  . Hyperlipidemia 06/22/2015  . Sepsis 06/15/2015  . Protein-calorie malnutrition, severe 06/15/2015    Past Surgical History  Procedure Laterality Date  . Abdominal hysterectomy      No current outpatient prescriptions on file.  Allergies Review of patient's allergies indicates no known allergies.  Family History  Problem Relation Age of Onset  . Diabetes Mellitus II Mother     Social History Social History  Substance Use Topics  . Smoking status: Never Smoker   . Smokeless tobacco: None  . Alcohol Use: No    Review of Systems Able to obtain due to dementia  ____________________________________________   PHYSICAL EXAM:  VITAL SIGNS: ED Triage Vitals  Enc  Vitals Group     BP 06/22/15 1137 155/65 mmHg     Pulse Rate 06/22/15 1137 96     Resp 06/22/15 1137 20     Temp 06/22/15 1137 97.8 F (36.6 C)     Temp Source 06/22/15 1137 Oral     SpO2 06/22/15 1137 93 %     Weight --      Height --      Head Cir --      Peak Flow --      Pain Score --      Pain Loc --      Pain Edu? --      Excl. in GC? --     Constitutional: Awake alert does not appear to be in any distress Eyes: Conjunctivae are normal. PERRL. EOMI. Head: Atraumatic. Nose: No congestion/rhinnorhea. Mouth/Throat: Mucous membranes are moist.  Oropharynx non-erythematous. Neck: No stridor. Cardiovascular: Normal rate, regular rhythm. Grossly normal heart sounds.  Good peripheral circulation. Respiratory: Normal respiratory effort.  No retractions. Lungs CTAB. Gastrointestinal: Soft and nontender. No distention. No abdominal bruits. No CVA tenderness. Genitourinary: Dark red blood per rectum rectal exam reveals a lot of blood there there is also a large lump of stool. Digital vaginal exam shows no blood. Musculoskeletal: No lower extremity tenderness nor edema.  No joint effusions. Neurologic:  Per family she is at her baseline Skin:  Skin is warm, dry and intact. No rash noted.   ____________________________________________   LABS (all labs ordered are listed, but only abnormal results are displayed)  Labs Reviewed  COMPREHENSIVE METABOLIC PANEL - Abnormal; Notable for the following:    Chloride 94 (*)    Glucose, Bld 177 (*)    BUN 22 (*)    Creatinine, Ser 1.25 (*)    Albumin 3.1 (*)    GFR calc non Af Amer 42 (*)    GFR calc Af Amer 49 (*)    Anion gap 16 (*)    All other components within normal limits  CBC WITH DIFFERENTIAL/PLATELET - Abnormal; Notable for the following:    Neutro Abs 6.7 (*)    All other components within normal limits  GLUCOSE, CAPILLARY - Abnormal; Notable for the following:    Glucose-Capillary 148 (*)    All other components within  normal limits  PROTIME-INR  HEMOGLOBIN  HEMOGLOBIN  HEMOGLOBIN A1C  HEMOGLOBIN  TYPE AND SCREEN  PREPARE RBC (CROSSMATCH)  ABO/RH   ____________________________________________  EKG  EKG normal sinus rhythm normal axis no acute changes ____________________________________________  RADIOLOGY  ____________________________________________   PROCEDURES   ____________________________________________   INITIAL IMPRESSION / ASSESSMENT AND PLAN / ED COURSE  Pertinent labs & imaging results that were available during my care of the patient were reviewed by me and considered in my medical decision making (see chart for details).   ____________________________________________   FINAL CLINICAL IMPRESSION(S) / ED DIAGNOSES  Final diagnoses:  Gastrointestinal hemorrhage, unspecified gastritis, unspecified gastrointestinal hemorrhage type      Arnaldo Natal, MD 06/22/15 [phone]

## 2015-06-22 NOTE — H&P (Addendum)
[facility] - [place] at [facility]   PATIENT NAME: Kari Carr    MR#:  [id number]  DATE OF BIRTH:  03/20/1941  DATE OF ADMISSION:  06/22/2015  PRIMARY CARE PHYSICIAN: [facility]   REQUESTING/REFERRING PHYSICIAN:   CHIEF COMPLAINT:   Chief Complaint  Patient presents with  . Rectal Bleeding   patient is 74 year old African-American female with past medical history significant for dementia,  CK D, diabetes, hyperlipidemia, hypertension who was brought by her family because of rectal bleeding. Apparently patient's home health nurse was at the house and she noted spot of blood on the depends,  there was no bleeding yesterday. Patient did have her last bowel movement which was soft yesterday, small in amount. Patient was brought to emergency room where her vital signs as well as hemoglobin were found to be stable. Emergency room physician did a rectal exam which revealed solid stool in rectal vault and some dark blood around. Patient is demented, not able to provide any history. History is obtained from family members  HISTORY OF PRESENT ILLNESS: Kari Carr  is a 74 y.o. female with a known history of dementia,  CK D, diabetes, hyperlipidemia, hypertension who was brought by her family because of rectal bleeding. Apparently patient's home health nurse was at the house and she noted spot of blood on the depends,  there was no bleeding yesterday. Patient did have her last bowel movement which was soft yesterday, small in amount. Patient was brought to emergency room where her vital signs as well as hemoglobin were found to be stable. Emergency room physician did a rectal exam which revealed solid stool in rectal vault and some dark blood around. Patient is demented, not able to provide any history. History is obtained from family members. Patient was noted to have Foley catheter which has been there since prior admission to the hospital and discharge  06/18/2015.   PAST MEDICAL HISTORY:   Past Medical History  Diagnosis Date  . Dementia   . Hypertension   . Diabetes mellitus without complication   . Hearing deficit     PAST SURGICAL HISTORY:  Past Surgical History  Procedure Laterality Date  . Abdominal hysterectomy      SOCIAL HISTORY:  Social History  Substance Use Topics  . Smoking status: Never Smoker   . Smokeless tobacco: Not on file  . Alcohol Use: No    FAMILY HISTORY:  Family History  Problem Relation Age of Onset  . Diabetes Mellitus II Mother     DRUG ALLERGIES: No Known Allergies  Review of Systems  Unable to perform ROS: dementia    MEDICATIONS AT HOME:  Prior to Admission medications   Medication Sig Start Date End Date Taking? Authorizing Provider  amLODipine (NORVASC) 5 MG tablet Take 1 tablet by mouth daily. 05/24/15  Yes Historical Provider, MD  atorvastatin (LIPITOR) 40 MG tablet Take 1 tablet by mouth daily. 05/24/15  Yes Historical Provider, MD  cephALEXin (KEFLEX) 250 MG capsule Take 1 capsule (250 mg total) by mouth 3 (three) times daily. 06/18/15  Yes Alford Highland, MD  cloNIDine (CATAPRES) 0.1 MG tablet Take 0.1 mg by mouth 2 (two) times daily.   Yes Historical Provider, MD  docusate sodium (COLACE) 100 MG capsule Take 1 capsule (100 mg total) by mouth 2 (two) times daily. 06/18/15  Yes Richard Renae Gloss, MD  LANTUS 100 UNIT/ML injection Inject 28 Units as directed daily. 04/28/15  Yes Historical Provider, MD  lisinopril-hydrochlorothiazide (PRINZIDE,ZESTORETIC) 20-12.5 MG per tablet Take 1 tablet by mouth daily.   Yes Historical Provider, MD  simvastatin (ZOCOR) 20 MG tablet Take 20 mg by mouth daily.   Yes Historical Provider, MD      PHYSICAL EXAMINATION:   VITAL SIGNS: Blood pressure 155/65, pulse 96, temperature 97.8 F (36.6 C), temperature source Oral, resp. rate 20, SpO2 93 %.  GENERAL:  74 y.o.-year-old patient lying in the bed with no acute distress.  EYES: Pupils equal, round,  reactive to light and accommodation. No scleral icterus. Extraocular muscles intact.  HEENT: Head atraumatic, normocephalic. Oropharynx and nasopharynx clear.  NECK:  Supple, no jugular venous distention. No thyroid enlargement, no tenderness.  LUNGS: Normal breath sounds bilaterally, no wheezing, rales,rhonchi or crepitation. No use of accessory muscles of respiration.  CARDIOVASCULAR: S1, S2 normal. No murmurs, rubs, or gallops.  ABDOMEN: Soft, nontender, nondistended. Bowel sounds present. No organomegaly or mass. Rectal exam done by emergency room revealed solid stool in the vault and some dark-looking blood around it, no active bleeding EXTREMITIES: No pedal edema, cyanosis, or clubbing.  NEUROLOGIC: Cranial nerves II through XII are intact. Muscle strength 5/5 in all extremities. Sensation intact. Gait not checked.  PSYCHIATRIC: The patient is alert and oriented x 3.  SKIN: No obvious rash, lesion, or ulcer.   LABORATORY PANEL:   CBC  Recent Labs Lab 06/15/15 1520 06/16/15 0639 06/22/15 1146  WBC 10.2 10.0 9.8  HGB 14.4 13.1 15.0  HCT 45.4 39.8 44.6  PLT 142* 159 218  MCV 93.9 92.5 90.2  MCH 29.9 30.5 30.2  MCHC 31.8* 33.0 33.5  RDW 15.0* 14.3 13.6  LYMPHSABS  --   --  2.0  MONOABS  --   --  0.8  EOSABS  --   --  0.1  BASOSABS  --   --  0.1   ------------------------------------------------------------------------------------------------------------------  Chemistries   Recent Labs Lab 06/22/15 1146  NA 137  K 3.7  CL 94*  CO2 27  GLUCOSE 177*  BUN 22*  CREATININE 1.25*  CALCIUM 9.3  AST 38  ALT 31  ALKPHOS 87  BILITOT 0.9   ------------------------------------------------------------------------------------------------------------------  Cardiac Enzymes No results for input(s): TROPONINI in the last 168 hours. ------------------------------------------------------------------------------------------------------------------  RADIOLOGY: No results  found.  EKG: Orders placed or performed during the hospital encounter of 06/15/15  . ED EKG  . ED EKG  . EKG 12-Lead  . EKG 12-Lead  . EKG 12-Lead  . EKG 12-Lead  . EKG   EKG reveals sinus rhythm with premature atrial compresses at rate of 92 bpm, normal axis, no acute ST-T changes  IMPRESSION AND PLAN:  Principal Problem:   Rectal bleeding Active Problems:   Rectal bleeding   Hypertension   Urinary retention   CKD (chronic kidney disease), stage III   Dementia   Diabetes mellitus type 2 in obese   Hyperlipidemia 1. Rectal bleeding, unclear etiology at present, admits the patient to medical floor, get the nuclear medicine study done if patient is actively bleeding get gastroenterologist involved for further recommendations. Initiate patient on clear liquid diet as preparation for possible colonoscopy 2. Essential hypertension. Continue outpatient management. Patient's blood pressure is a little elevated, possibly stress related 3. Urinary retention. Patient has Foley catheter placed, educated patient's family that urology consultation which may be beneficial to withdraw Foley catheter and avoid catheter related infections, voiced understanding. He was concerned that patient has urinary tract infection related to urinary retention, however, patient's urine cultures are revealing  gram-positive rods which are likely  skin flora 4. CK D. Stable. Continue outpatient management, supportive care 5. Dementia. Supportive care 6. Diabetes mellitus. Patient will be initiated on clear liquid diet as well as sliding scale insulin and insulin Lantus, doses she  usually takes at home 7. Hyperlipidemia. Simvastatin    All the records are reviewed and case discussed with ED provider. Management plans discussed with the patient, family and they are in agreement. All questions were answered, and emotional support was provided  CODE STATUS:    TOTAL TIME TAKING CARE OF THIS PATIENT: 55 minutes.     Katharina Caper M.D on 06/22/2015 at 12:51 PM  Between 7am to 6pm - Pager - [phone] After 6pm go to [email or web address] - password [email or web address] [facility]  [facility]  Office  [phone]  CC: Primary care physician; [facility]

## 2015-06-22 NOTE — Progress Notes (Signed)
ED visit made. Patient is currently followed at home by [facility] with a terminal diagnosis of Alzheimer's dementia. She is a DNR code. CMRN Judeth Cornfield and ED attending physician Dr. Darnelle Catalan made aware. Writer was alerted by patient's home care RN that she had new onset rectal bleeding this morning. She was sent to the [facility] ED via EMS for evaluation. She was recently admitted to [facility] for treatment of a UTI and discharged on oral antibiotics on 9/3.  Patient seen lying on the ED stretcher, alert, she is nonverbal at baseline, husband Ivar Drape present at bedside. Patient appeared comfortable. Portable DNR did accompany patient. Clarene Duke confirmed that the current plan is for Mrs. Labombard to be admitted for further testing. Hemoglobin was 15 per lab report, she has been typed and screened for a transfusion if necessary.CMRN Raiford Noble also notified as plan is for patient to be admitted to room 224. Will continue to follow through final disposition. Thank you. Dayna Barker Rn, BSN, [facility] [place], [facility] [phone] c

## 2015-06-22 NOTE — Progress Notes (Signed)
I have seen and examined Kari Carr and agree with Nelta Numbers' a/p.   Mild rectal bleeding with no change in Hgb.  Bleelding now resolved.  GIven her dementia, poor functional status, would not recommend any further studies at this time.   If she were to develop active bleeding, recommend bleeding scan.    If no further bleeding and Hgb stable in am, safe for d/c with outpt monitoring of Hgb by PCP.

## 2015-06-22 NOTE — ED Notes (Signed)
DNR added to patient chart.

## 2015-06-22 NOTE — Consult Note (Signed)
GI Inpatient Consult Note  Reason for Consult: Rectal bleed   Attending Requesting Consult: [place]  History of Present Illness: Kari Carr is a 74 y.o. female with a h/o HTN, hyperlipidemia, DM, CKD, and dementia admitted for rectal bleeding.  The history is provided the patient's family d/t her dementia.  Patient was brought to the [facility] ED after a home health nurse reported a spot of blood on her depends.  A small amount of blood was also noted with a soft BM yesterday.  In the ED, vital signs and Hgb (15.0) were stable.  Rectal exam revealed solid stool in the rectal vault, as well as dark blood.  She was admitted for further evaluation and management.  Since admission, patient remains hemodynamically stable with a Hgb 14.1 as of 2pm.  Per patient's son and husband present at bedside, Kari Carr's health has been stable recently with no acute changes.  They deny recent fever, chills, weight loss, h/o anemia, and previous GI bleed.  They are unsure if she ever had a colonoscopy or upper endoscopy.   No associated symptoms like abdominal pain, nausea, vomiting, appetite changes, and weight loss.  Also no recent NSAIDs use, EtOH, or tobacco.  They state she does not c/o additional GI symptoms like dysphagia, reflux, diarrhea, constipation, hematochezia, and melena.   Past Medical History:  Past Medical History  Diagnosis Date  . Dementia   . Hypertension   . Diabetes mellitus without complication   . Hearing deficit     Problem List: Patient Active Problem List   Diagnosis Date Noted  . Rectal bleeding 06/22/2015  . Rectal bleeding 06/22/2015  . CKD (chronic kidney disease), stage III 06/22/2015  . Hypertension 06/22/2015  . Urinary retention 06/22/2015  . Dementia 06/22/2015  . Diabetes mellitus type 2 in obese 06/22/2015  . Hyperlipidemia 06/22/2015  . Sepsis 06/15/2015  . Protein-calorie malnutrition, severe 06/15/2015    Past Surgical History: Past Surgical History   Procedure Laterality Date  . Abdominal hysterectomy      Allergies: No Known Allergies  Home Medications: Prescriptions prior to admission  Medication Sig Dispense Refill Last Dose  . amLODipine (NORVASC) 5 MG tablet Take 1 tablet by mouth daily.   unknown at unknown  . atorvastatin (LIPITOR) 40 MG tablet Take 1 tablet by mouth daily.     . cephALEXin (KEFLEX) 250 MG capsule Take 1 capsule (250 mg total) by mouth 3 (three) times daily. 15 capsule 0   . cloNIDine (CATAPRES) 0.1 MG tablet Take 0.1 mg by mouth 2 (two) times daily.   unknown at unknown  . docusate sodium (COLACE) 100 MG capsule Take 1 capsule (100 mg total) by mouth 2 (two) times daily. 60 capsule 0   . LANTUS 100 UNIT/ML injection Inject 28 Units as directed daily.   unknown at unknown  . lisinopril-hydrochlorothiazide (PRINZIDE,ZESTORETIC) 20-12.5 MG per tablet Take 1 tablet by mouth daily.   unknown at unknown  . simvastatin (ZOCOR) 20 MG tablet Take 20 mg by mouth daily.   unknown at unknown   Home medication reconciliation was completed with the patient.   Scheduled Inpatient Medications:   . sodium chloride  10 mL/hr Intravenous Once    Continuous Inpatient Infusions:     PRN Inpatient Medications:    Family History: family history includes Diabetes Mellitus II in her mother.  The patient's family history is negative for inflammatory bowel disorders, GI malignancy, or solid organ transplantation.  Social History:   reports that  she has never smoked. She does not have any smokeless tobacco history on file. She reports that she does not drink alcohol or use illicit drugs.   Review of Systems: Unable to perform d/t patient's dementia.   Physical Examination: BP 136/70 mmHg  Pulse 86  Temp(Src) 97.8 F (36.6 C) (Oral)  Resp 19  SpO2 96% Gen: NAD, alert, responsive to commands but non-communicative HEENT: PEERLA, EOMI, Neck: supple, no JVD or thyromegaly Chest: CTA bilaterally, no wheezes, crackles, or  other adventitious sounds CV: RRR, no m/g/c/r Abd: soft, NT, ND, +BS in all four quadrants; no HSM, guarding, ridigity, or rebound tenderness Ext: no edema, well perfused with 2+ pulses, Skin: no rash or lesions noted Lymph: no LAD  Data: Lab Results  Component Value Date   WBC 9.8 06/22/2015   HGB 15.0 06/22/2015   HCT 44.6 06/22/2015   MCV 90.2 06/22/2015   PLT 218 06/22/2015    Recent Labs Lab 06/15/15 1520 06/16/15 0639 06/22/15 1146  HGB 14.4 13.1 15.0   Lab Results  Component Value Date   NA 137 06/22/2015   K 3.7 06/22/2015   CL 94* 06/22/2015   CO2 27 06/22/2015   BUN 22* 06/22/2015   CREATININE 1.25* 06/22/2015   Lab Results  Component Value Date   ALT 31 06/22/2015   AST 38 06/22/2015   ALKPHOS 87 06/22/2015   BILITOT 0.9 06/22/2015    Recent Labs Lab 06/22/15 1146  INR 0.97   Assessment/Plan: Kari Carr is a 74 y.o. female with a h/o HTN, hyperlipidemia, DM, CKD, and dementia admitted for rectal bleeding.  Home health nurse noted a spot of blood on depends, also small amount of blood with a soft BM. She is currently hemodynamically stable with last Hgb 14.1 with no further bleeding noted during my visit.  She has no alarm symptoms for UGIB or CCA.  Given this, the bleeding is likely due to bleeding internal hemorrhoids vs fissure vs diverticular bleed.  We will continue to follow her Hgb and vitals, if moderate bleeding occurs will consider tagged scan.  If she remains stable with minimal bleeding, I recommend OP follow-up with [facility].  If there is a drop in Hgb or continued symptoms upon d/c, the risks and benefits of a flex sig in the setting of the patient's overall health and functional status would need to be considered.  Recommendations: - Continue to monitor Hgb and vitals - If moderate bleeding occurs, will order tagged scan - If Hgb drops significantly, will consider flex sign - Schedule OP follow-up with [facility] upon discharge - will also  consider flex sig if sx persist after d/c  Thank you for the consult. We will follow along with you. Please call with questions or concerns.  Burman Freestone, PA-C [facility] Phone: [phone] Pager: [phone]

## 2015-06-22 NOTE — Progress Notes (Signed)
Pt's husband stated that pt has not taken lantus today but did give all oral meds in the morning. Pt's blood sugar showed 148 at 1601. Informed Dr. Winona Legato of situation and asked for advice. Dr. Winona Legato stated to discontinue lantus use while in the hospital and to use insulin sliding scale to control pt's blood sugar.

## 2015-06-22 NOTE — ED Notes (Signed)
Pt presents from EMS with rectal bleeding noticed this morning.

## 2015-06-22 NOTE — ED Notes (Addendum)
Inetta Fermo RN reports copious amount of blood wiped away upon rectal assessment.

## 2015-06-23 LAB — TYPE AND SCREEN
ABO/RH(D): A POS
Antibody Screen: NEGATIVE
UNIT DIVISION: 0
Unit division: 0

## 2015-06-23 LAB — GLUCOSE, CAPILLARY
GLUCOSE-CAPILLARY: 173 mg/dL — AB (ref 65–99)
Glucose-Capillary: 181 mg/dL — ABNORMAL HIGH (ref 65–99)

## 2015-06-23 LAB — HEMOGLOBIN A1C: Hgb A1c MFr Bld: 10.8 % — ABNORMAL HIGH (ref 4.0–6.0)

## 2015-06-23 LAB — HEMOGLOBIN: HEMOGLOBIN: 13.4 g/dL (ref 12.0–16.0)

## 2015-06-23 NOTE — Discharge Instructions (Signed)
Bloody Stools  Bloody stools often mean that there is a problem in the digestive tract. Your caregiver may use the term "melena" to describe black, tarry, and bad smelling stools or "hematochezia" to describe red or maroon-colored stools. Blood seen in the stool can be caused by bleeding anywhere along the intestinal tract.   A black stool usually means that blood is coming from the upper part of the gastrointestinal tract (esophagus, stomach, or small bowel). Passing maroon-colored stools or bright red blood usually means that blood is coming from lower down in the large bowel or the rectum. However, sometimes massive bleeding in the stomach or small intestine can cause bright red bloody stools.   Consuming black licorice, lead, iron pills, medicines containing bismuth subsalicylate, or blueberries can also cause black stools. Your caregiver can test black stools to see if blood is present.  It is important that the cause of the bleeding be found. Treatment can then be started, and the problem can be corrected. Rectal bleeding may not be serious, but you should not assume everything is okay until you know the cause. It is very important to follow up with your caregiver or a specialist in gastrointestinal problems.  CAUSES   Blood in the stools can come from various underlying causes. Often, the cause is not found during your first visit. Testing is often needed to discover the cause of bleeding in the gastrointestinal tract. Causes range from simple to serious or even life-threatening. Possible causes include:  · Hemorrhoids. These are veins that are full of blood (engorged) in the rectum. They cause pain, inflammation, and may bleed.  · Anal fissures. These are areas of painful tearing which may bleed. They are often caused by passing hard stool.  · Diverticulosis. These are pouches that form on the colon over time, with age, and may bleed significantly.  · Diverticulitis. This is inflammation in areas with  diverticulosis. It can cause pain, fever, and bloody stools, although bleeding is rare.  · Proctitis and colitis. These are inflamed areas of the rectum or colon. They may cause pain, fever, and bloody stools.  · Polyps and cancer. Colon cancer is a leading cause of preventable cancer death. It often starts out as precancerous polyps that can be removed during a colonoscopy, preventing progression into cancer. Sometimes, polyps and cancer may cause rectal bleeding.  · Gastritis and ulcers. Bleeding from the upper gastrointestinal tract (near the stomach) may travel through the intestines and produce black, sometimes tarry, often bad smelling stools. In certain cases, if the bleeding is fast enough, the stools may not be black, but red and the condition may be life-threatening.  SYMPTOMS   You may have stools that are bright red and bloody, that are normal color with blood on them, or that are dark black and tarry. In some cases, you may only have blood in the toilet bowl. Any of these cases need medical care. You may also have:  · Pain at the anus or anywhere in the rectum.  · Lightheadedness or feeling faint.  · Extreme weakness.  · Nausea or vomiting.  · Fever.  DIAGNOSIS  Your caregiver may use the following methods to find the cause of your bleeding:  · Taking a medical history. Age is important. Older people tend to develop polyps and cancer more often. If there is anal pain and a hard, large stool associated with bleeding, a tear of the anus may be the cause. If blood drips into the toilet after a bowel movement, bleeding hemorrhoids may be the   problem. The color and frequency of the bleeding are additional considerations. In most cases, the medical history provides clues, but seldom the final answer.  · A visual and finger (digital) exam. Your caregiver will inspect the anal area, looking for tears and hemorrhoids. A finger exam can provide information when there is tenderness or a growth inside. In men, the  prostate is also examined.  · Endoscopy. Several types of small, long scopes (endoscopes) are used to view the colon.  ¨ In the office, your caregiver may use a rigid, or more commonly, a flexible viewing sigmoidoscope. This exam is called flexible sigmoidoscopy. It is performed in 5 to 10 minutes.  ¨ A more thorough exam is accomplished with a colonoscope. It allows your caregiver to view the entire 5 to 6 foot long colon. Medicine to help you relax (sedative) is usually given for this exam. Frequently, a bleeding lesion may be present beyond the reach of the sigmoidoscope. So, a colonoscopy may be the best exam to start with. Both exams are usually done on an outpatient basis. This means the patient does not stay overnight in the hospital or surgery center.  ¨ An upper endoscopy may be needed to examine your stomach. Sedation is used and a flexible endoscope is put in your mouth, down to your stomach.  · A barium enema X-ray. This is an X-ray exam. It uses liquid barium inserted by enema into the rectum. This test alone may not identify an actual bleeding point. X-rays highlight abnormal shadows, such as those made by lumps (tumors), diverticuli, or colitis.  TREATMENT   Treatment depends on the cause of your bleeding.   · For bleeding from the stomach or colon, the caregiver doing your endoscopy or colonoscopy may be able to stop the bleeding as part of the procedure.  · Inflammation or infection of the colon can be treated with medicines.  · Many rectal problems can be treated with creams, suppositories, or warm baths.  · Surgery is sometimes needed.  · Blood transfusions are sometimes needed if you have lost a lot of blood.  · For any bleeding problem, let your caregiver know if you take aspirin or other blood thinners regularly.  HOME CARE INSTRUCTIONS   · Take any medicines exactly as prescribed.  · Keep your stools soft by eating a diet high in fiber. Prunes (1 to 3 a day) work well for many people.  · Drink  enough water and fluids to keep your urine clear or pale yellow.  · Take sitz baths if advised. A sitz bath is when you sit in a bathtub with warm water for 10 to 15 minutes to soak, soothe, and cleanse the rectal area.  · If enemas or suppositories are advised, be sure you know how to use them. Tell your caregiver if you have problems with this.  · Monitor your bowel movements to look for signs of improvement or worsening.  SEEK MEDICAL CARE IF:   · You do not improve in the time expected.  · Your condition worsens after initial improvement.  · You develop any new symptoms.  SEEK IMMEDIATE MEDICAL CARE IF:   · You develop severe or prolonged rectal bleeding.  · You vomit blood.  · You feel weak or faint.  · You have a fever.  MAKE SURE YOU:  · Understand these instructions.  · Will watch your condition.  · Will get help right away if you are not doing well or get worse.    Document Released: 09/21/2002 Document Revised: 12/24/2011 Document Reviewed: 02/16/2011  [employer]® Patient Information ©2015 [employer]. This information is not intended to replace advice given to you by your health care provider. Make sure you discuss any questions you have with your health care provider.

## 2015-06-23 NOTE — Clinical Social Work Note (Signed)
Patient to discharge to return home today with hospice following. CSW asked to prepare EMS form. CSW has prepared the EMS form. York Spaniel MSW,LCSW [phone]

## 2015-06-23 NOTE — ED Notes (Signed)
Call by house supervisor to present to bedside in room 224 in order to attempt PIV placement using ultrasound. RN and EDT to bedside. Patient's upper extremities assessed prior to using ultrasound; vein located in RAC/RUA. 22g PIV placed without difficulties; brisk blood return with easy to flush with 10cc NS. Line secured and primary nurse made aware. PIV documented on LDA section in [facility] per policy.

## 2015-06-23 NOTE — Progress Notes (Signed)
Visit made. Patient seen lying in bed, appeared to be sleeping. Husband Ivar Drape at bedside, he confirms chart notes that patient is for discharge home today. Patient appears comfortable. No further bleeding per chart notes. Hemoglobin is 13.4 today, was 15 on admission. GI consult does not recommend any further investigative or invasive testing at this point. Discharge summary and updated information faxed to referral intake.  PLEASE NOTE: both progress and discharge summary refer to "a small amount of blood on the patient's depend" seen by the home health nurse.  Patient was and is seen by a  HOSPICE nurse. The amount of blood seen was significant, the hospice nurse estimated it at 500-750 ml.. The blood saturated underwear and 3 pads that were used in the home and patient was still bleeding when she arrived to the ED.  Patient to discharge home via EMS, correct address given to CSW Kosse. Staff RN Charisse March to provide discharge instructions to patient's husband. Foley catheter to remain in place due to urinary retention. Thank you. Dayna Barker RN, BSN, [facility] [place], [facility] [phone] c

## 2015-06-23 NOTE — Discharge Summary (Signed)
[facility] -  at [facility]   PATIENT NAME: Kari Carr    MR#:  [id number]  DATE OF BIRTH:  Jul 28, 1941  DATE OF ADMISSION:  06/22/2015 ADMITTING PHYSICIAN: Katharina Caper, MD  DATE OF DISCHARGE: 06/23/2015  PRIMARY CARE PHYSICIAN: [facility]    ADMISSION DIAGNOSIS:  Gastrointestinal hemorrhage, unspecified gastritis, unspecified gastrointestinal hemorrhage type [K92.2]  DISCHARGE DIAGNOSIS:  Principal Problem:   Rectal bleeding Active Problems:   Rectal bleeding   CKD (chronic kidney disease), stage III   Hypertension   Urinary retention   Dementia   Diabetes mellitus type 2 in obese   Hyperlipidemia   SECONDARY DIAGNOSIS:   Past Medical History  Diagnosis Date  . Dementia   . Hypertension   . Diabetes mellitus without complication   . Hearing deficit     HOSPITAL COURSE:   74 year old female with a history of chronic kidney disease stage III, severe dementia and diabetes who presented with rectal bleeding. For further details please refer the H&P.  1. Rectal bleeding: The home health nurse noted a spot of blood on her depends pad and a small amount of blood with a soft bowel movement. She remained hemodynamically stable and hemoglobin remained relatively stable. She had no evidence of rectal bleeding during hospitalization. GI consultation was appreciated. They recommended tagged scan if she continued to have bleeding. We did not see any bleeding as mentioned already. Patient may be discharged with outpatient follow-up with GI.  2. Essential hypertension: Continue clonidine, Norvasc, lisinopril and HCTZ.  3. Dementia: At baseline.  4. Hyperlipidemia: Continue simvastatin  5. Diabetes type 2: Continue outpatient medications. Continue ADA diet.  DISCHARGE CONDITIONS AND DIET:  Patient is being discharged home with hospice in stable condition on a regular diet  CONSULTS OBTAINED:  Treatment Team:  Elnita Maxwell, MD  DRUG ALLERGIES:  No Known Allergies  DISCHARGE MEDICATIONS:   Current Discharge Medication List    CONTINUE these medications which have NOT CHANGED   Details  amLODipine (NORVASC) 5 MG tablet Take 1 tablet by mouth daily.    atorvastatin (LIPITOR) 40 MG tablet Take 1 tablet by mouth daily.    cephALEXin (KEFLEX) 250 MG capsule Take 1 capsule (250 mg total) by mouth 3 (three) times daily. Qty: 15 capsule, Refills: 0    cloNIDine (CATAPRES) 0.1 MG tablet Take 0.1 mg by mouth 2 (two) times daily.    docusate sodium (COLACE) 100 MG capsule Take 1 capsule (100 mg total) by mouth 2 (two) times daily. Qty: 60 capsule, Refills: 0    LANTUS 100 UNIT/ML injection Inject 28 Units as directed daily.    lisinopril-hydrochlorothiazide (PRINZIDE,ZESTORETIC) 20-12.5 MG per tablet Take 1 tablet by mouth daily.    simvastatin (ZOCOR) 20 MG tablet Take 20 mg by mouth daily.              Today   CHIEF COMPLAINT:  Patient is nonverbal at baseline   VITAL SIGNS:  Blood pressure 149/59, pulse 81, temperature 97.9 F (36.6 C), temperature source Oral, resp. rate 14, height 5\' 9"  (1.753 m), weight 69.446 kg (153 lb 1.6 oz), SpO2 99 %.   REVIEW OF SYSTEMS:  Review of Systems  Unable to perform ROS    PHYSICAL EXAMINATION:  GENERAL:  74 y.o.-year-old patient lying in the bed with no acute distress.  NECK:  Supple, no jugular venous distention. No thyroid enlargement, no tenderness.  LUNGS: Normal breath sounds bilaterally, no wheezing, rales,rhonchi  No use of accessory muscles of respiration.  CARDIOVASCULAR: S1, S2 normal. No murmurs, rubs, or gallops.  ABDOMEN: Soft, non-tender, non-distended. Bowel sounds present. No organomegaly or mass.  EXTREMITIES: No pedal edema, cyanosis, or clubbing.  PSYCHIATRIC: The patient is alert  SKIN: No obvious rash, lesion, or ulcer.   DATA REVIEW:   CBC  Recent Labs Lab 06/22/15 1146  06/23/15 0650  WBC 9.8   --   --   HGB 15.0  < > 13.4  HCT 44.6  --   --   PLT 218  --   --   < > = values in this interval not displayed.  Chemistries   Recent Labs Lab 06/22/15 1146  NA 137  K 3.7  CL 94*  CO2 27  GLUCOSE 177*  BUN 22*  CREATININE 1.25*  CALCIUM 9.3  AST 38  ALT 31  ALKPHOS 87  BILITOT 0.9    Cardiac Enzymes No results for input(s): TROPONINI in the last 168 hours.  Microbiology Results  @  RADIOLOGY:  No results found.    Management plans discussed with the patient's husband and he is in agreement. Stable for discharge home with hospice  Patient should follow up with PCP in 1 week adn GI  CODE STATUS:     Code Status Orders        Start     Ordered   06/22/15 1641  Do not attempt resuscitation (DNR)   Continuous    Question Answer Comment  In the event of cardiac or respiratory ARREST Do not call a "code blue"   In the event of cardiac or respiratory ARREST Do not perform Intubation, CPR, defibrillation or ACLS   In the event of cardiac or respiratory ARREST Use medication by any route, position, wound care, and other measures to relive pain and suffering. May use oxygen, suction and manual treatment of airway obstruction as needed for comfort.      06/22/15 1641    Advance Directive Documentation        Most Recent Value   Type of Advance Directive  Out of facility DNR (pink MOST or yellow form)   Pre-existing out of facility DNR order (yellow form or pink MOST form)  Physician notified to receive inpatient order   "MOST" Form in Place?        TOTAL TIME TAKING CARE OF THIS PATIENT: 35 minutes.    Clayton Bosserman M.D on 06/23/2015 at 11:18 AM  Between 7am to 6pm - Pager - [phone] After 6pm go to [email or web address] - password [email or web address] [facility]  [place] [facility]  Office  [phone]  CC: Primary care physician; [facility]

## 2015-06-23 NOTE — Care Management (Signed)
Case being followed by [facility] and [facility]. Dayna Barker informed of discharge today.

## 2015-06-23 NOTE — Progress Notes (Signed)
IV and tele removed. Pt lying in bed resting with husband at bedside. Discharge instructions and follow-up appointments were discussed with pt and husband. All questions answered. Called EMS to await for transport back to pt's home.

## 2015-07-16 DEATH — deceased

## 2017-05-09 IMAGING — CR DG HIP (WITH OR WITHOUT PELVIS) 2-3V*L*
3 series · 3 of 3 positions shown · non-contrast
Comparison: None.

CLINICAL DATA: Left hip pain.

EXAM:
DG HIP (WITH OR WITHOUT PELVIS) 2-3V LEFT

[hip ap]
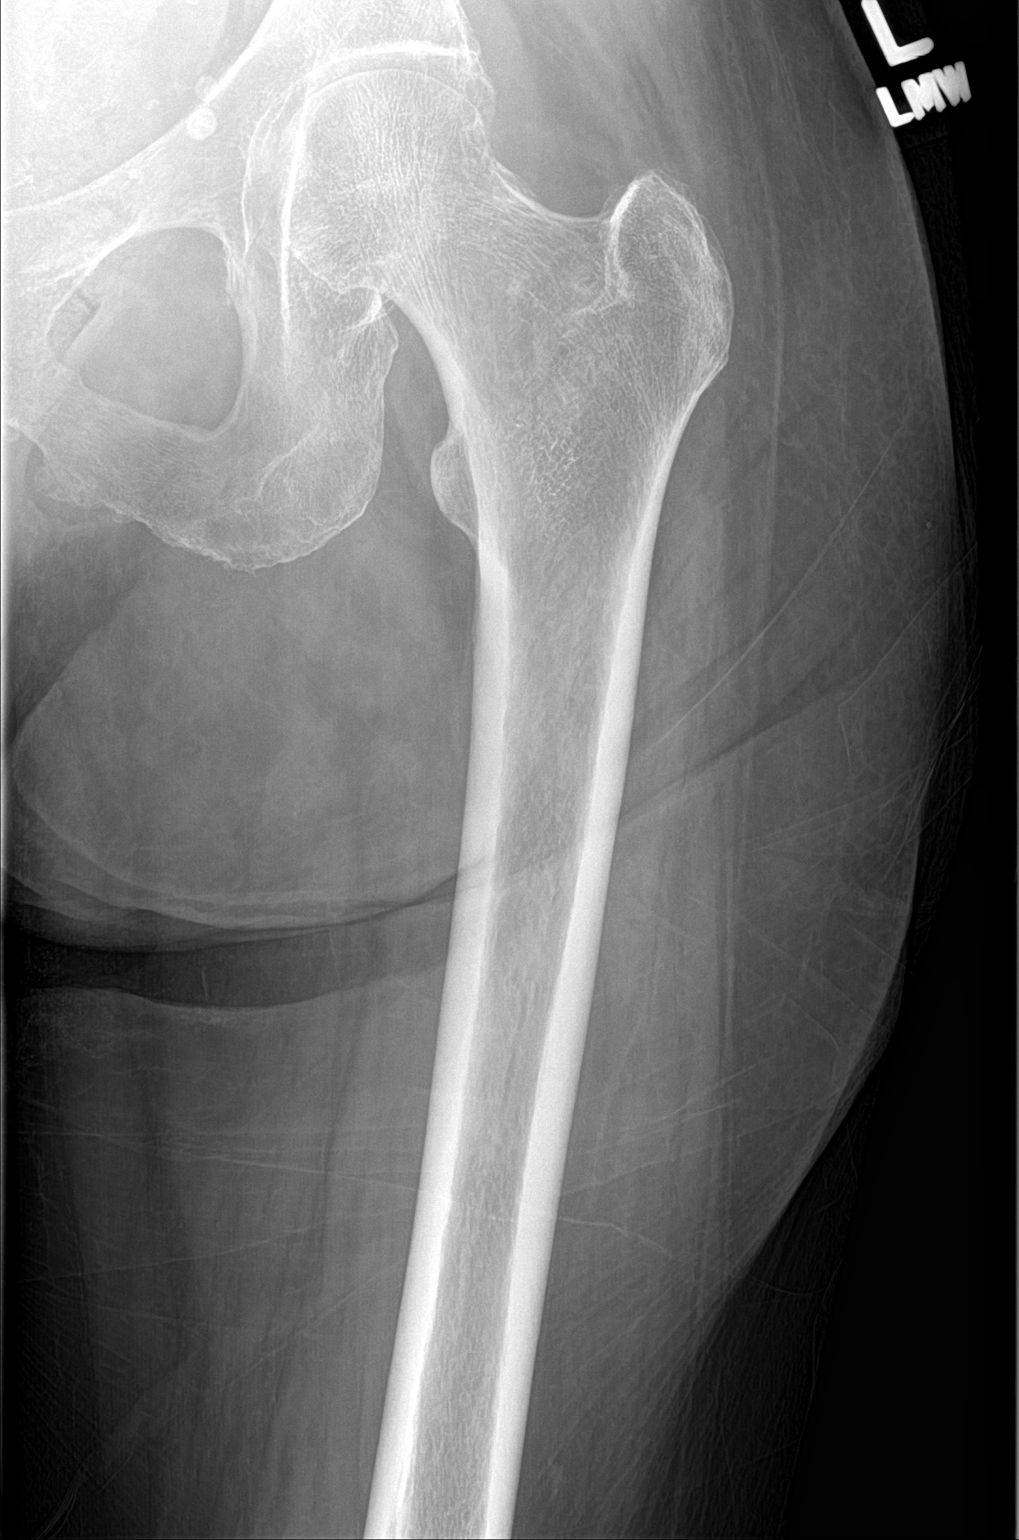

[hip lat]
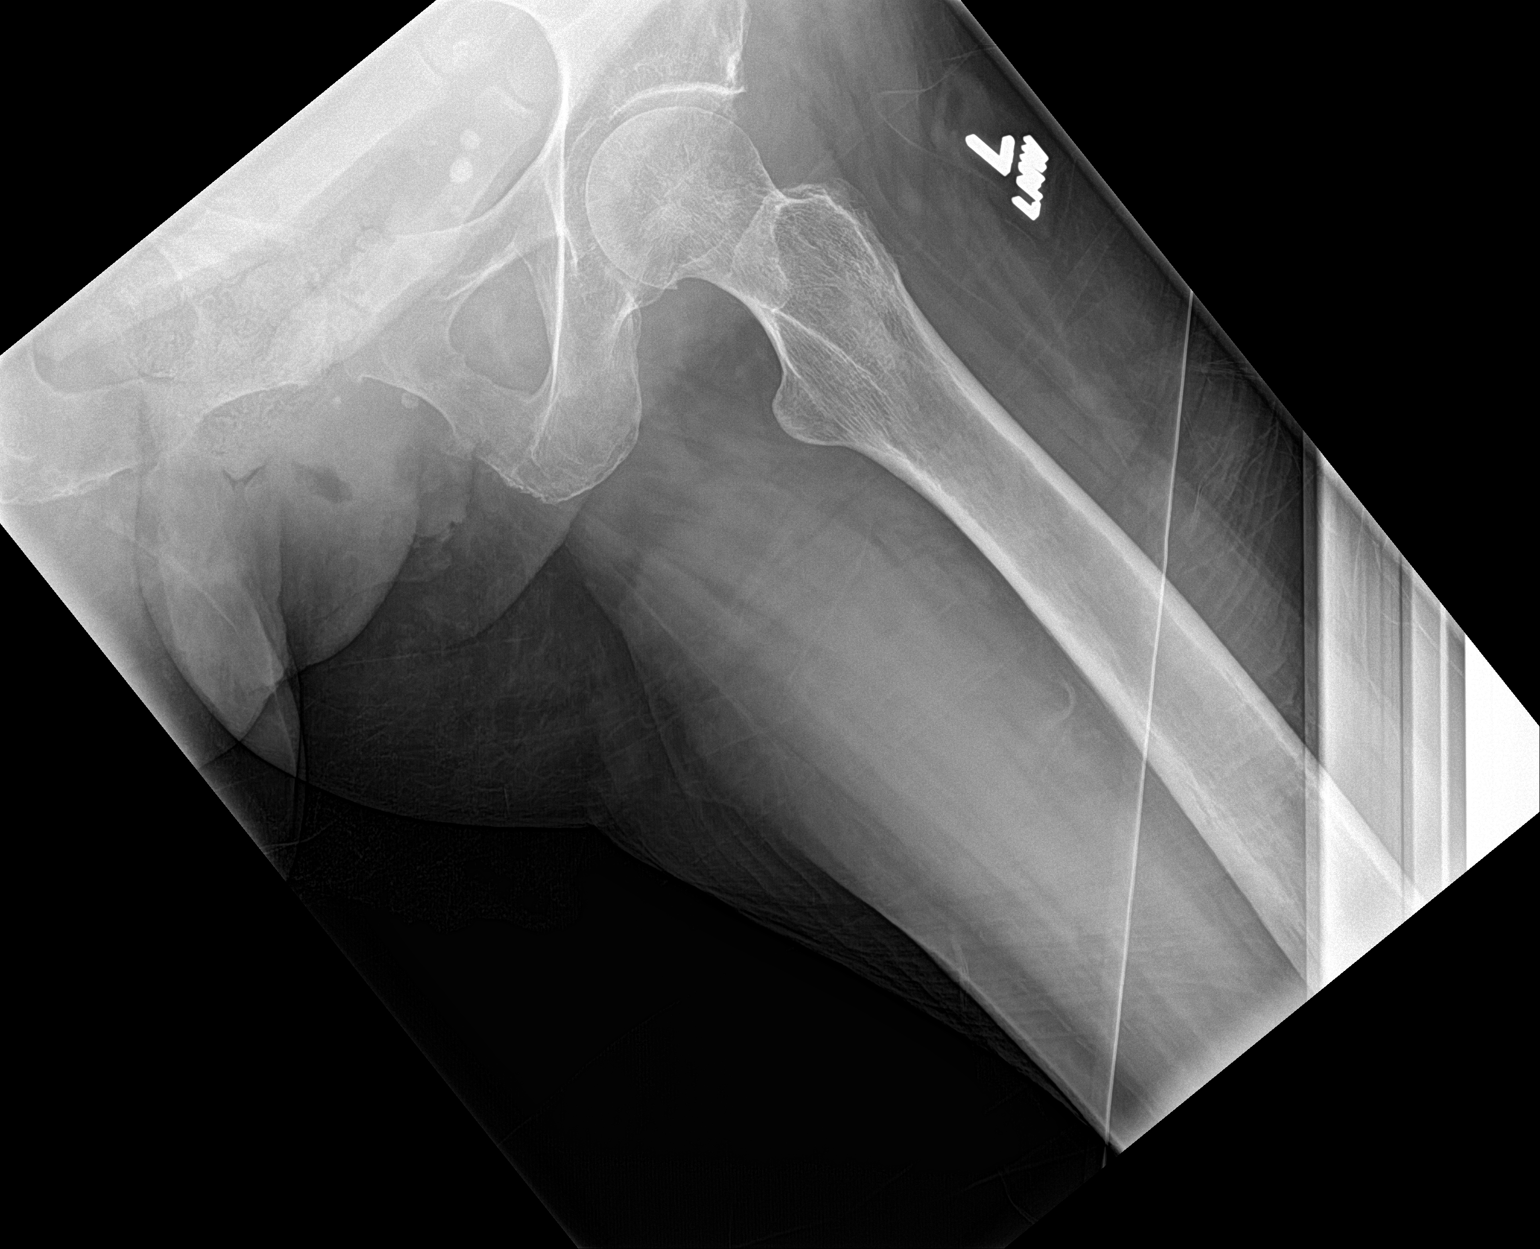

[pelvis ap]
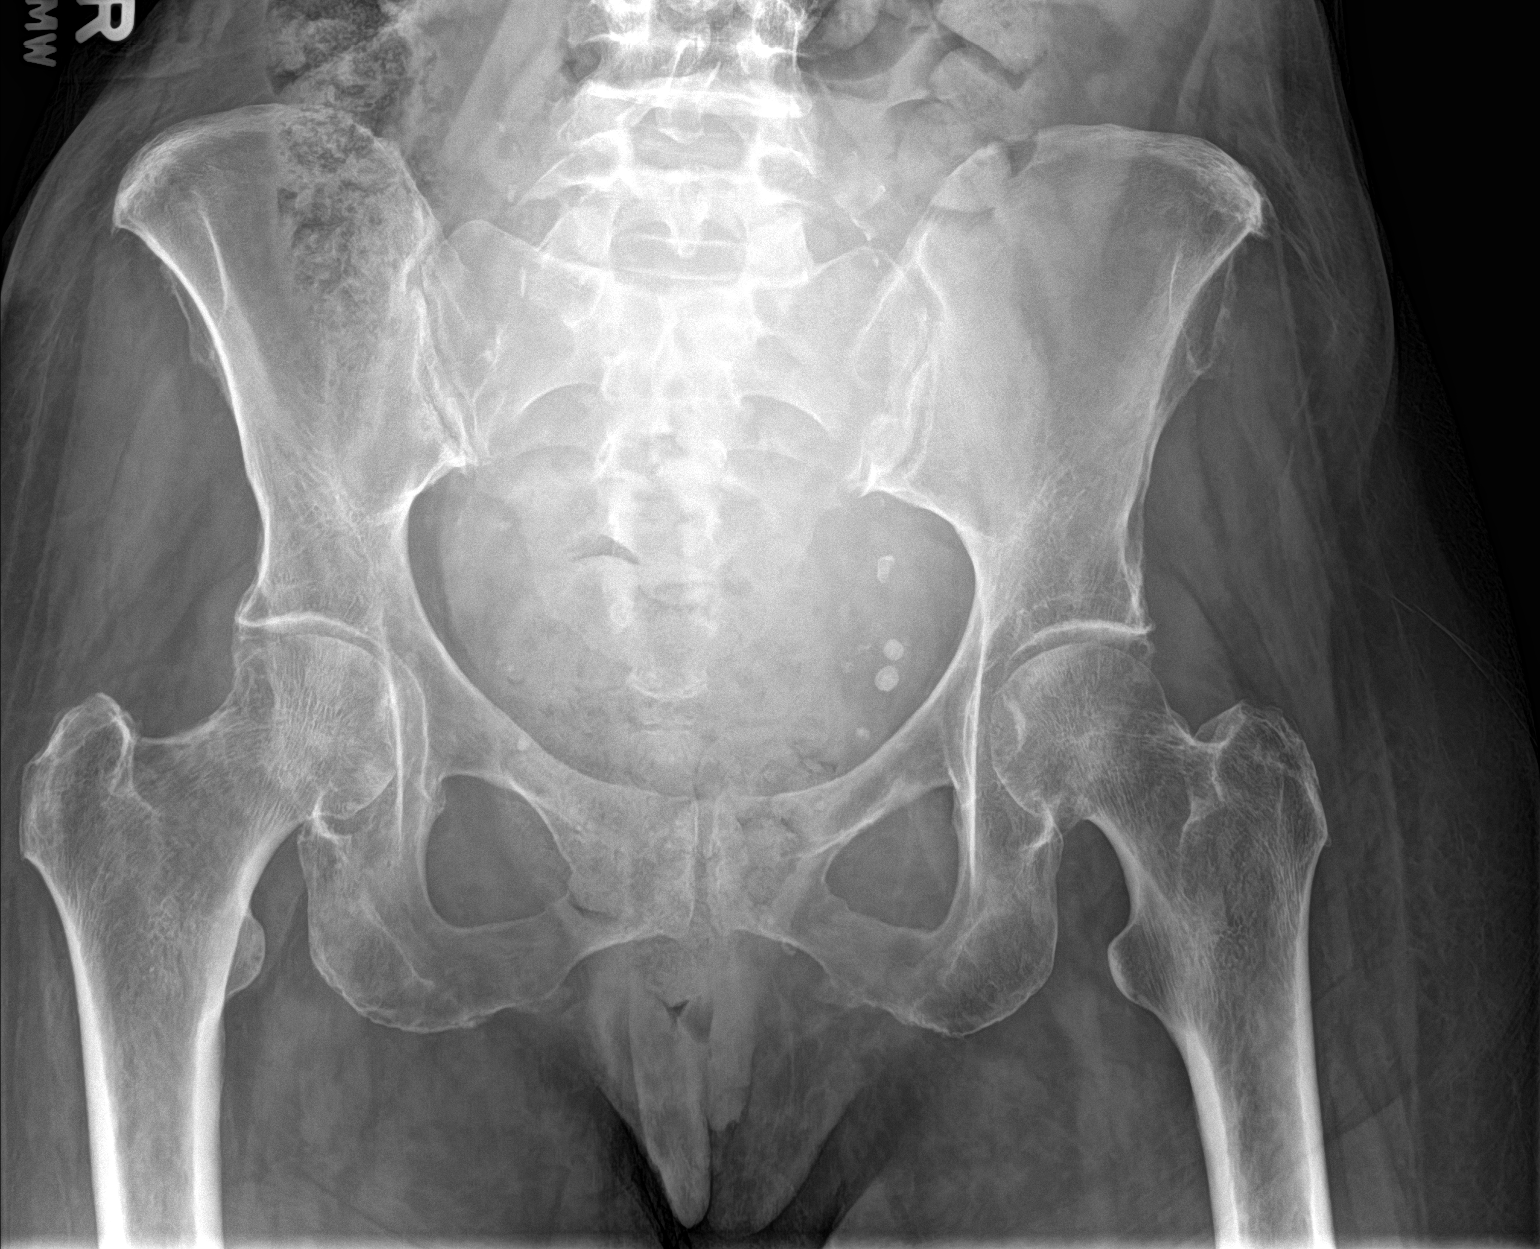

[3 of 3 positions shown; findings below may reference images not displayed]

FINDINGS: Negative for fracture, dislocation or radiopaque foreign body.
Minimal arthritic changes are present. There is no bone lesion or
bony destruction.
IMPRESSION: Negative.
